# Patient Record
Sex: Female | Born: 1935 | Race: White | Hispanic: No
Health system: Southern US, Community
[De-identification: ages and names within clinical notes are randomized; demographics above are authoritative.]

## PROBLEM LIST (undated history)

## (undated) DIAGNOSIS — T148XXA Other injury of unspecified body region, initial encounter: Secondary | ICD-10-CM

## (undated) DIAGNOSIS — E7800 Pure hypercholesterolemia, unspecified: Secondary | ICD-10-CM

## (undated) DIAGNOSIS — R55 Syncope and collapse: Secondary | ICD-10-CM

## (undated) DIAGNOSIS — S0101XA Laceration without foreign body of scalp, initial encounter: Secondary | ICD-10-CM

## (undated) DIAGNOSIS — E119 Type 2 diabetes mellitus without complications: Secondary | ICD-10-CM

## (undated) DIAGNOSIS — I10 Essential (primary) hypertension: Secondary | ICD-10-CM

## (undated) HISTORY — DX: Other injury of unspecified body region, initial encounter: T14.8XXA

## (undated) HISTORY — PX: CHOLECYSTECTOMY: SHX55

## (undated) HISTORY — PX: CARDIAC VALVE REPLACEMENT: SHX585

## (undated) HISTORY — PX: CATARACT EXTRACTION W/ INTRAOCULAR LENS IMPLANT: SHX1309

## (undated) HISTORY — DX: Syncope and collapse: R55

## (undated) HISTORY — DX: Type 2 diabetes mellitus without complications: E11.9

## (undated) HISTORY — DX: Essential (primary) hypertension: I10

## (undated) HISTORY — DX: Laceration without foreign body of scalp, initial encounter: S01.01XA

## (undated) HISTORY — DX: Pure hypercholesterolemia, unspecified: E78.00

---

## 2009-02-17 ENCOUNTER — Ambulatory Visit: Payer: Self-pay | Admitting: Thoracic Surgery (Cardiothoracic Vascular Surgery)

## 2009-03-04 ENCOUNTER — Encounter: Payer: Self-pay | Admitting: Thoracic Surgery (Cardiothoracic Vascular Surgery)

## 2009-03-04 ENCOUNTER — Encounter
Admission: RE | Admit: 2009-03-04 | Discharge: 2009-03-04 | Payer: Self-pay | Admitting: Thoracic Surgery (Cardiothoracic Vascular Surgery)

## 2009-03-04 ENCOUNTER — Ambulatory Visit (HOSPITAL_COMMUNITY)
Admission: RE | Admit: 2009-03-04 | Discharge: 2009-03-04 | Payer: Self-pay | Admitting: Thoracic Surgery (Cardiothoracic Vascular Surgery)

## 2009-03-10 ENCOUNTER — Ambulatory Visit: Payer: Self-pay | Admitting: Vascular Surgery

## 2009-03-10 ENCOUNTER — Encounter: Payer: Self-pay | Admitting: Thoracic Surgery (Cardiothoracic Vascular Surgery)

## 2009-03-10 ENCOUNTER — Ambulatory Visit: Payer: Self-pay | Admitting: Thoracic Surgery (Cardiothoracic Vascular Surgery)

## 2009-03-12 ENCOUNTER — Encounter: Payer: Self-pay | Admitting: Thoracic Surgery (Cardiothoracic Vascular Surgery)

## 2009-03-12 ENCOUNTER — Ambulatory Visit: Payer: Self-pay | Admitting: Thoracic Surgery (Cardiothoracic Vascular Surgery)

## 2009-03-12 ENCOUNTER — Inpatient Hospital Stay (HOSPITAL_COMMUNITY)
Admission: RE | Admit: 2009-03-12 | Discharge: 2009-03-16 | Payer: Self-pay | Admitting: Thoracic Surgery (Cardiothoracic Vascular Surgery)

## 2009-03-31 ENCOUNTER — Encounter: Payer: Self-pay | Admitting: Thoracic Surgery (Cardiothoracic Vascular Surgery)

## 2009-03-31 ENCOUNTER — Ambulatory Visit (HOSPITAL_COMMUNITY)
Admission: RE | Admit: 2009-03-31 | Discharge: 2009-03-31 | Payer: Self-pay | Source: Home / Self Care | Admitting: Thoracic Surgery (Cardiothoracic Vascular Surgery)

## 2009-03-31 ENCOUNTER — Encounter
Admission: RE | Admit: 2009-03-31 | Discharge: 2009-03-31 | Payer: Self-pay | Admitting: Thoracic Surgery (Cardiothoracic Vascular Surgery)

## 2009-03-31 ENCOUNTER — Ambulatory Visit: Payer: Self-pay | Admitting: Thoracic Surgery (Cardiothoracic Vascular Surgery)

## 2009-05-12 ENCOUNTER — Ambulatory Visit: Payer: Self-pay | Admitting: Thoracic Surgery (Cardiothoracic Vascular Surgery)

## 2010-02-01 ENCOUNTER — Encounter: Payer: Self-pay | Admitting: Thoracic Surgery (Cardiothoracic Vascular Surgery)

## 2010-04-02 LAB — COMPREHENSIVE METABOLIC PANEL
ALT: 19 U/L (ref 0–35)
AST: 20 U/L (ref 0–37)
Albumin: 3.6 g/dL (ref 3.5–5.2)
Alkaline Phosphatase: 64 U/L (ref 39–117)
BUN: 36 mg/dL — ABNORMAL HIGH (ref 6–23)
CO2: 23 mEq/L (ref 19–32)
Calcium: 9.2 mg/dL (ref 8.4–10.5)
Chloride: 103 mEq/L (ref 96–112)
Creatinine, Ser: 1.53 mg/dL — ABNORMAL HIGH (ref 0.4–1.2)
GFR calc Af Amer: 40 mL/min — ABNORMAL LOW (ref >60)
GFR calc non Af Amer: 33 mL/min — ABNORMAL LOW (ref >60)
Glucose, Bld: 222 mg/dL — ABNORMAL HIGH (ref 70–99)
Potassium: 4 mEq/L (ref 3.5–5.1)
Sodium: 136 mEq/L (ref 135–145)
Total Bilirubin: 0.5 mg/dL (ref 0.3–1.2)
Total Protein: 6.6 g/dL (ref 6.0–8.3)

## 2010-04-02 LAB — BLOOD GAS, ARTERIAL
Acid-Base Excess: 0.7 mmol/L (ref 0.0–2.0)
Bicarbonate: 24.5 mEq/L — ABNORMAL HIGH (ref 20.0–24.0)
Drawn by: 206361
FIO2: 0.21 %
O2 Saturation: 99.2 %
Patient temperature: 98.6
TCO2: 25.7 mmol/L (ref 0–100)
pCO2 arterial: 37.8 mmHg (ref 35.0–45.0)
pH, Arterial: 7.428 — ABNORMAL HIGH (ref 7.350–7.400)
pO2, Arterial: 106 mmHg — ABNORMAL HIGH (ref 80.0–100.0)

## 2010-04-02 LAB — CBC
HCT: 32.3 % — ABNORMAL LOW (ref 36.0–46.0)
Hemoglobin: 11 g/dL — ABNORMAL LOW (ref 12.0–15.0)
MCHC: 34.1 g/dL (ref 30.0–36.0)
MCV: 83.7 fL (ref 78.0–100.0)
Platelets: 212 10*3/uL (ref 150–400)
RBC: 3.86 MIL/uL — ABNORMAL LOW (ref 3.87–5.11)
RDW: 16.5 % — ABNORMAL HIGH (ref 11.5–15.5)
WBC: 9.1 10*3/uL (ref 4.0–10.5)

## 2010-04-02 LAB — URINALYSIS, ROUTINE W REFLEX MICROSCOPIC
Bilirubin Urine: NEGATIVE
Glucose, UA: 100 mg/dL — AB
Hgb urine dipstick: NEGATIVE
Ketones, ur: NEGATIVE mg/dL
Nitrite: NEGATIVE
Protein, ur: NEGATIVE mg/dL
Specific Gravity, Urine: 1.008 (ref 1.005–1.030)
Urobilinogen, UA: 0.2 mg/dL (ref 0.0–1.0)
pH: 5 (ref 5.0–8.0)

## 2010-04-02 LAB — SURGICAL PCR SCREEN
MRSA, PCR: NEGATIVE
Staphylococcus aureus: NEGATIVE

## 2010-04-02 LAB — TYPE AND SCREEN
ABO/RH(D): A POS
Antibody Screen: NEGATIVE

## 2010-04-02 LAB — ABO/RH: ABO/RH(D): A POS

## 2010-04-02 LAB — PROTIME-INR
INR: 0.96 (ref 0.00–1.49)
Prothrombin Time: 12.7 seconds (ref 11.6–15.2)

## 2010-04-02 LAB — HEMOGLOBIN A1C
Hgb A1c MFr Bld: 6.6 % — ABNORMAL HIGH (ref 4.6–6.1)
Mean Plasma Glucose: 143 mg/dL

## 2010-04-02 LAB — APTT: aPTT: 25 seconds (ref 24–37)

## 2010-04-02 LAB — URINE MICROSCOPIC-ADD ON

## 2010-04-06 LAB — POCT I-STAT 3, ART BLOOD GAS (G3+)
Acid-base deficit: 1 mmol/L (ref 0.0–2.0)
Acid-base deficit: 1 mmol/L (ref 0.0–2.0)
Acid-base deficit: 1 mmol/L (ref 0.0–2.0)
Acid-base deficit: 1 mmol/L (ref 0.0–2.0)
Acid-base deficit: 3 mmol/L — ABNORMAL HIGH (ref 0.0–2.0)
Bicarbonate: 22.8 mEq/L (ref 20.0–24.0)
Bicarbonate: 23.6 mEq/L (ref 20.0–24.0)
Bicarbonate: 24.1 mEq/L — ABNORMAL HIGH (ref 20.0–24.0)
Bicarbonate: 24.2 mEq/L — ABNORMAL HIGH (ref 20.0–24.0)
Bicarbonate: 24.9 mEq/L — ABNORMAL HIGH (ref 20.0–24.0)
Bicarbonate: 25.3 mEq/L — ABNORMAL HIGH (ref 20.0–24.0)
Bicarbonate: 25.4 mEq/L — ABNORMAL HIGH (ref 20.0–24.0)
O2 Saturation: 100 %
O2 Saturation: 100 %
O2 Saturation: 100 %
O2 Saturation: 97 %
O2 Saturation: 97 %
O2 Saturation: 97 %
O2 Saturation: 99 %
Patient temperature: 35.7
Patient temperature: 36.9
Patient temperature: 37.1
TCO2: 24 mmol/L (ref 0–100)
TCO2: 25 mmol/L (ref 0–100)
TCO2: 25 mmol/L (ref 0–100)
TCO2: 25 mmol/L (ref 0–100)
TCO2: 26 mmol/L (ref 0–100)
TCO2: 27 mmol/L (ref 0–100)
TCO2: 27 mmol/L (ref 0–100)
pCO2 arterial: 37.8 mmHg (ref 35.0–45.0)
pCO2 arterial: 39.1 mmHg (ref 35.0–45.0)
pCO2 arterial: 40.5 mmHg (ref 35.0–45.0)
pCO2 arterial: 41.3 mmHg (ref 35.0–45.0)
pCO2 arterial: 42.7 mmHg (ref 35.0–45.0)
pCO2 arterial: 43.9 mmHg (ref 35.0–45.0)
pCO2 arterial: 47.8 mmHg — ABNORMAL HIGH (ref 35.0–45.0)
pH, Arterial: 7.323 — ABNORMAL LOW (ref 7.350–7.400)
pH, Arterial: 7.331 — ABNORMAL LOW (ref 7.350–7.400)
pH, Arterial: 7.374 (ref 7.350–7.400)
pH, Arterial: 7.379 (ref 7.350–7.400)
pH, Arterial: 7.397 (ref 7.350–7.400)
pH, Arterial: 7.398 (ref 7.350–7.400)
pH, Arterial: 7.403 — ABNORMAL HIGH (ref 7.350–7.400)
pO2, Arterial: 178 mmHg — ABNORMAL HIGH (ref 80.0–100.0)
pO2, Arterial: 258 mmHg — ABNORMAL HIGH (ref 80.0–100.0)
pO2, Arterial: 289 mmHg — ABNORMAL HIGH (ref 80.0–100.0)
pO2, Arterial: 413 mmHg — ABNORMAL HIGH (ref 80.0–100.0)
pO2, Arterial: 84 mmHg (ref 80.0–100.0)
pO2, Arterial: 91 mmHg (ref 80.0–100.0)
pO2, Arterial: 99 mmHg (ref 80.0–100.0)

## 2010-04-06 LAB — PROTIME-INR
INR: 1.47 (ref 0.00–1.49)
Prothrombin Time: 17.7 seconds — ABNORMAL HIGH (ref 11.6–15.2)

## 2010-04-06 LAB — POCT I-STAT GLUCOSE
Glucose, Bld: 108 mg/dL — ABNORMAL HIGH (ref 70–99)
Glucose, Bld: 117 mg/dL — ABNORMAL HIGH (ref 70–99)
Operator id: 195151
Operator id: 195151

## 2010-04-06 LAB — CBC
HCT: 25.7 % — ABNORMAL LOW (ref 36.0–46.0)
HCT: 30.8 % — ABNORMAL LOW (ref 36.0–46.0)
HCT: 31 % — ABNORMAL LOW (ref 36.0–46.0)
HCT: 31.9 % — ABNORMAL LOW (ref 36.0–46.0)
HCT: 32.4 % — ABNORMAL LOW (ref 36.0–46.0)
HCT: 32.9 % — ABNORMAL LOW (ref 36.0–46.0)
Hemoglobin: 10.5 g/dL — ABNORMAL LOW (ref 12.0–15.0)
Hemoglobin: 10.6 g/dL — ABNORMAL LOW (ref 12.0–15.0)
Hemoglobin: 11.1 g/dL — ABNORMAL LOW (ref 12.0–15.0)
Hemoglobin: 11.1 g/dL — ABNORMAL LOW (ref 12.0–15.0)
Hemoglobin: 11.4 g/dL — ABNORMAL LOW (ref 12.0–15.0)
Hemoglobin: 8.7 g/dL — ABNORMAL LOW (ref 12.0–15.0)
MCHC: 33.8 g/dL (ref 30.0–36.0)
MCHC: 34 g/dL (ref 30.0–36.0)
MCHC: 34.1 g/dL (ref 30.0–36.0)
MCHC: 34.2 g/dL (ref 30.0–36.0)
MCHC: 34.5 g/dL (ref 30.0–36.0)
MCHC: 34.8 g/dL (ref 30.0–36.0)
MCV: 85.4 fL (ref 78.0–100.0)
MCV: 87.3 fL (ref 78.0–100.0)
MCV: 87.5 fL (ref 78.0–100.0)
MCV: 87.7 fL (ref 78.0–100.0)
MCV: 87.8 fL (ref 78.0–100.0)
MCV: 89 fL (ref 78.0–100.0)
Platelets: 101 10*3/uL — ABNORMAL LOW (ref 150–400)
Platelets: 102 10*3/uL — ABNORMAL LOW (ref 150–400)
Platelets: 103 10*3/uL — ABNORMAL LOW (ref 150–400)
Platelets: 112 10*3/uL — ABNORMAL LOW (ref 150–400)
Platelets: 112 10*3/uL — ABNORMAL LOW (ref 150–400)
Platelets: 118 10*3/uL — ABNORMAL LOW (ref 150–400)
RBC: 3.01 MIL/uL — ABNORMAL LOW (ref 3.87–5.11)
RBC: 3.46 MIL/uL — ABNORMAL LOW (ref 3.87–5.11)
RBC: 3.53 MIL/uL — ABNORMAL LOW (ref 3.87–5.11)
RBC: 3.65 MIL/uL — ABNORMAL LOW (ref 3.87–5.11)
RBC: 3.69 MIL/uL — ABNORMAL LOW (ref 3.87–5.11)
RBC: 3.76 MIL/uL — ABNORMAL LOW (ref 3.87–5.11)
RDW: 15.4 % (ref 11.5–15.5)
RDW: 15.5 % (ref 11.5–15.5)
RDW: 15.6 % — ABNORMAL HIGH (ref 11.5–15.5)
RDW: 15.7 % — ABNORMAL HIGH (ref 11.5–15.5)
RDW: 15.9 % — ABNORMAL HIGH (ref 11.5–15.5)
RDW: 16.2 % — ABNORMAL HIGH (ref 11.5–15.5)
WBC: 11.1 10*3/uL — ABNORMAL HIGH (ref 4.0–10.5)
WBC: 12.3 10*3/uL — ABNORMAL HIGH (ref 4.0–10.5)
WBC: 13.5 10*3/uL — ABNORMAL HIGH (ref 4.0–10.5)
WBC: 13.6 10*3/uL — ABNORMAL HIGH (ref 4.0–10.5)
WBC: 15 10*3/uL — ABNORMAL HIGH (ref 4.0–10.5)
WBC: 15.6 10*3/uL — ABNORMAL HIGH (ref 4.0–10.5)

## 2010-04-06 LAB — POCT I-STAT 4, (NA,K, GLUC, HGB,HCT)
Glucose, Bld: 108 mg/dL — ABNORMAL HIGH (ref 70–99)
Glucose, Bld: 122 mg/dL — ABNORMAL HIGH (ref 70–99)
Glucose, Bld: 127 mg/dL — ABNORMAL HIGH (ref 70–99)
Glucose, Bld: 148 mg/dL — ABNORMAL HIGH (ref 70–99)
Glucose, Bld: 180 mg/dL — ABNORMAL HIGH (ref 70–99)
Glucose, Bld: 180 mg/dL — ABNORMAL HIGH (ref 70–99)
Glucose, Bld: 189 mg/dL — ABNORMAL HIGH (ref 70–99)
Glucose, Bld: 92 mg/dL (ref 70–99)
HCT: 20 % — ABNORMAL LOW (ref 36.0–46.0)
HCT: 22 % — ABNORMAL LOW (ref 36.0–46.0)
HCT: 22 % — ABNORMAL LOW (ref 36.0–46.0)
HCT: 26 % — ABNORMAL LOW (ref 36.0–46.0)
HCT: 26 % — ABNORMAL LOW (ref 36.0–46.0)
HCT: 27 % — ABNORMAL LOW (ref 36.0–46.0)
HCT: 31 % — ABNORMAL LOW (ref 36.0–46.0)
HCT: 33 % — ABNORMAL LOW (ref 36.0–46.0)
Hemoglobin: 10.5 g/dL — ABNORMAL LOW (ref 12.0–15.0)
Hemoglobin: 11.2 g/dL — ABNORMAL LOW (ref 12.0–15.0)
Hemoglobin: 6.8 g/dL — CL (ref 12.0–15.0)
Hemoglobin: 7.5 g/dL — ABNORMAL LOW (ref 12.0–15.0)
Hemoglobin: 7.5 g/dL — ABNORMAL LOW (ref 12.0–15.0)
Hemoglobin: 8.8 g/dL — ABNORMAL LOW (ref 12.0–15.0)
Hemoglobin: 8.8 g/dL — ABNORMAL LOW (ref 12.0–15.0)
Hemoglobin: 9.2 g/dL — ABNORMAL LOW (ref 12.0–15.0)
Potassium: 3.9 mEq/L (ref 3.5–5.1)
Potassium: 4 mEq/L (ref 3.5–5.1)
Potassium: 4 mEq/L (ref 3.5–5.1)
Potassium: 4.3 mEq/L (ref 3.5–5.1)
Potassium: 4.5 mEq/L (ref 3.5–5.1)
Potassium: 4.8 mEq/L (ref 3.5–5.1)
Potassium: 4.9 mEq/L (ref 3.5–5.1)
Potassium: 5.4 mEq/L — ABNORMAL HIGH (ref 3.5–5.1)
Sodium: 132 mEq/L — ABNORMAL LOW (ref 135–145)
Sodium: 132 mEq/L — ABNORMAL LOW (ref 135–145)
Sodium: 132 mEq/L — ABNORMAL LOW (ref 135–145)
Sodium: 133 mEq/L — ABNORMAL LOW (ref 135–145)
Sodium: 136 mEq/L (ref 135–145)
Sodium: 138 mEq/L (ref 135–145)
Sodium: 139 mEq/L (ref 135–145)
Sodium: 139 mEq/L (ref 135–145)

## 2010-04-06 LAB — POCT I-STAT, CHEM 8
BUN: 29 mg/dL — ABNORMAL HIGH (ref 6–23)
BUN: 32 mg/dL — ABNORMAL HIGH (ref 6–23)
Calcium, Ion: 1.08 mmol/L — ABNORMAL LOW (ref 1.12–1.32)
Calcium, Ion: 1.1 mmol/L — ABNORMAL LOW (ref 1.12–1.32)
Chloride: 101 mEq/L (ref 96–112)
Chloride: 109 mEq/L (ref 96–112)
Creatinine, Ser: 1.2 mg/dL (ref 0.4–1.2)
Creatinine, Ser: 1.8 mg/dL — ABNORMAL HIGH (ref 0.4–1.2)
Glucose, Bld: 148 mg/dL — ABNORMAL HIGH (ref 70–99)
Glucose, Bld: 175 mg/dL — ABNORMAL HIGH (ref 70–99)
HCT: 29 % — ABNORMAL LOW (ref 36.0–46.0)
HCT: 33 % — ABNORMAL LOW (ref 36.0–46.0)
Hemoglobin: 11.2 g/dL — ABNORMAL LOW (ref 12.0–15.0)
Hemoglobin: 9.9 g/dL — ABNORMAL LOW (ref 12.0–15.0)
Potassium: 4.3 mEq/L (ref 3.5–5.1)
Potassium: 4.8 mEq/L (ref 3.5–5.1)
Sodium: 134 mEq/L — ABNORMAL LOW (ref 135–145)
Sodium: 138 mEq/L (ref 135–145)
TCO2: 26 mmol/L (ref 0–100)
TCO2: 26 mmol/L (ref 0–100)

## 2010-04-06 LAB — BASIC METABOLIC PANEL
BUN: 29 mg/dL — ABNORMAL HIGH (ref 6–23)
BUN: 35 mg/dL — ABNORMAL HIGH (ref 6–23)
BUN: 35 mg/dL — ABNORMAL HIGH (ref 6–23)
BUN: 39 mg/dL — ABNORMAL HIGH (ref 6–23)
CO2: 24 mEq/L (ref 19–32)
CO2: 25 mEq/L (ref 19–32)
CO2: 25 mEq/L (ref 19–32)
CO2: 28 mEq/L (ref 19–32)
Calcium: 7.1 mg/dL — ABNORMAL LOW (ref 8.4–10.5)
Calcium: 8.1 mg/dL — ABNORMAL LOW (ref 8.4–10.5)
Calcium: 8.4 mg/dL (ref 8.4–10.5)
Calcium: 8.4 mg/dL (ref 8.4–10.5)
Chloride: 103 mEq/L (ref 96–112)
Chloride: 103 mEq/L (ref 96–112)
Chloride: 109 mEq/L (ref 96–112)
Chloride: 99 mEq/L (ref 96–112)
Creatinine, Ser: 1.3 mg/dL — ABNORMAL HIGH (ref 0.4–1.2)
Creatinine, Ser: 1.37 mg/dL — ABNORMAL HIGH (ref 0.4–1.2)
Creatinine, Ser: 1.38 mg/dL — ABNORMAL HIGH (ref 0.4–1.2)
Creatinine, Ser: 1.49 mg/dL — ABNORMAL HIGH (ref 0.4–1.2)
GFR calc Af Amer: 42 mL/min — ABNORMAL LOW (ref >60)
GFR calc Af Amer: 46 mL/min — ABNORMAL LOW (ref >60)
GFR calc Af Amer: 46 mL/min — ABNORMAL LOW (ref >60)
GFR calc Af Amer: 49 mL/min — ABNORMAL LOW (ref >60)
GFR calc non Af Amer: 34 mL/min — ABNORMAL LOW (ref >60)
GFR calc non Af Amer: 38 mL/min — ABNORMAL LOW (ref >60)
GFR calc non Af Amer: 38 mL/min — ABNORMAL LOW (ref >60)
GFR calc non Af Amer: 40 mL/min — ABNORMAL LOW (ref >60)
Glucose, Bld: 113 mg/dL — ABNORMAL HIGH (ref 70–99)
Glucose, Bld: 115 mg/dL — ABNORMAL HIGH (ref 70–99)
Glucose, Bld: 144 mg/dL — ABNORMAL HIGH (ref 70–99)
Glucose, Bld: 147 mg/dL — ABNORMAL HIGH (ref 70–99)
Potassium: 3.6 mEq/L (ref 3.5–5.1)
Potassium: 3.7 mEq/L (ref 3.5–5.1)
Potassium: 4 mEq/L (ref 3.5–5.1)
Potassium: 4.1 mEq/L (ref 3.5–5.1)
Sodium: 132 mEq/L — ABNORMAL LOW (ref 135–145)
Sodium: 135 mEq/L (ref 135–145)
Sodium: 135 mEq/L (ref 135–145)
Sodium: 139 mEq/L (ref 135–145)

## 2010-04-06 LAB — MAGNESIUM
Magnesium: 2.6 mg/dL — ABNORMAL HIGH (ref 1.5–2.5)
Magnesium: 2.6 mg/dL — ABNORMAL HIGH (ref 1.5–2.5)
Magnesium: 3 mg/dL — ABNORMAL HIGH (ref 1.5–2.5)

## 2010-04-06 LAB — GLUCOSE, CAPILLARY
Glucose-Capillary: 103 mg/dL — ABNORMAL HIGH (ref 70–99)
Glucose-Capillary: 104 mg/dL — ABNORMAL HIGH (ref 70–99)
Glucose-Capillary: 107 mg/dL — ABNORMAL HIGH (ref 70–99)
Glucose-Capillary: 114 mg/dL — ABNORMAL HIGH (ref 70–99)
Glucose-Capillary: 133 mg/dL — ABNORMAL HIGH (ref 70–99)
Glucose-Capillary: 135 mg/dL — ABNORMAL HIGH (ref 70–99)
Glucose-Capillary: 142 mg/dL — ABNORMAL HIGH (ref 70–99)
Glucose-Capillary: 150 mg/dL — ABNORMAL HIGH (ref 70–99)
Glucose-Capillary: 152 mg/dL — ABNORMAL HIGH (ref 70–99)
Glucose-Capillary: 154 mg/dL — ABNORMAL HIGH (ref 70–99)
Glucose-Capillary: 155 mg/dL — ABNORMAL HIGH (ref 70–99)
Glucose-Capillary: 156 mg/dL — ABNORMAL HIGH (ref 70–99)
Glucose-Capillary: 159 mg/dL — ABNORMAL HIGH (ref 70–99)
Glucose-Capillary: 162 mg/dL — ABNORMAL HIGH (ref 70–99)
Glucose-Capillary: 163 mg/dL — ABNORMAL HIGH (ref 70–99)
Glucose-Capillary: 169 mg/dL — ABNORMAL HIGH (ref 70–99)
Glucose-Capillary: 170 mg/dL — ABNORMAL HIGH (ref 70–99)
Glucose-Capillary: 173 mg/dL — ABNORMAL HIGH (ref 70–99)
Glucose-Capillary: 173 mg/dL — ABNORMAL HIGH (ref 70–99)
Glucose-Capillary: 177 mg/dL — ABNORMAL HIGH (ref 70–99)
Glucose-Capillary: 190 mg/dL — ABNORMAL HIGH (ref 70–99)
Glucose-Capillary: 82 mg/dL (ref 70–99)

## 2010-04-06 LAB — PLATELET COUNT: Platelets: 167 10*3/uL (ref 150–400)

## 2010-04-06 LAB — CREATININE, SERUM
Creatinine, Ser: 1.24 mg/dL — ABNORMAL HIGH (ref 0.4–1.2)
Creatinine, Ser: 1.64 mg/dL — ABNORMAL HIGH (ref 0.4–1.2)
GFR calc Af Amer: 37 mL/min — ABNORMAL LOW (ref >60)
GFR calc Af Amer: 52 mL/min — ABNORMAL LOW (ref >60)
GFR calc non Af Amer: 31 mL/min — ABNORMAL LOW (ref >60)
GFR calc non Af Amer: 43 mL/min — ABNORMAL LOW (ref >60)

## 2010-04-06 LAB — MRSA PCR SCREENING: MRSA by PCR: NEGATIVE

## 2010-04-06 LAB — POCT I-STAT 3, VENOUS BLOOD GAS (G3P V)
Acid-base deficit: 2 mmol/L (ref 0.0–2.0)
Bicarbonate: 23.2 mEq/L (ref 20.0–24.0)
O2 Saturation: 71 %
TCO2: 24 mmol/L (ref 0–100)
pCO2, Ven: 40.4 mmHg — ABNORMAL LOW (ref 45.0–50.0)
pH, Ven: 7.367 — ABNORMAL HIGH (ref 7.250–7.300)
pO2, Ven: 39 mmHg (ref 30.0–45.0)

## 2010-04-06 LAB — HEMOGLOBIN AND HEMATOCRIT, BLOOD
HCT: 26.8 % — ABNORMAL LOW (ref 36.0–46.0)
Hemoglobin: 9.1 g/dL — ABNORMAL LOW (ref 12.0–15.0)

## 2010-04-06 LAB — APTT: aPTT: 35 seconds (ref 24–37)

## 2010-05-26 NOTE — Assessment & Plan Note (Signed)
OFFICE VISIT   [name], [name]  DOB:  [birth year]                                        May 12, 2009  CHART #:  [id number]   HISTORY OF PRESENT ILLNESS:  The patient returns for further followup  status post aortic valve replacement 2 months ago.  She was last seen  here in the office on March 31, 2009.  At that time, she was doing quite  well, although she did have significant lower extremity edema that was  more prominent in the right lower extremity than the left.  We did a  lower extremity venous duplex scan that did not reveal any sign to  suggest the presence of the deep venous thrombosis.  She was restarted  on oral diuretic therapy at that time.  Since then, she had done quite  well.  She reports that she is taking part in the cardiac rehab program  and continues to see improvement every day.  She notes that her exercise  tolerance is already much better than ever was prior to surgery.  Her  lower extremity swelling has decreased, although it has not completely  gone away.  She notes that if she is up on her feet for prolonged period  of time, the swelling seems to get worse.  Overall, she continues to get  better every day and she feels quite well.  She has no fevers, chills,  or productive cough.  She has no shortness of breath.  The remainder of  her review of systems is unremarkable.   PHYSICAL EXAMINATION:  Notable for a well-appearing female with blood  pressure 133/68, pulse 72, and oxygen saturation 97% on room air.  Examination of the chest notable for a median sternotomy scar that has  healed nicely.  The sternum is stable on palpation.  Auscultation  reveals clear breath sounds, which are symmetrical bilaterally.  No  wheezes, rales, or rhonchi are noted.  Cardiovascular exam is notable  for regular rate and rhythm.  No murmurs, rubs, or gallops are  appreciated.  The abdomen is soft and nontender.  The extremities are  warm and well perfused.   There is mild bilateral lower extremity edema  that is slightly worse on the right compared to the left.  No other  abnormalities are noted.   IMPRESSION:  Excellent progress following aortic valve replacement.   PLAN:  I have encouraged the patient to continue increase her physical  activity as tolerated with her only limitation at this point remaining  that she refrain from heavy lifting or strenuous use of her arms or  shoulders for at least another 6 weeks or so.  All of her questions have  been addressed.  In the future she will call or return to see [place] as  needed.   Valentina Gu. Roxy Manns, M.D.  Electronically Signed   CHO/MEDQ  D:  05/12/2009  T:  05/13/2009  Job:  [id number]   cc:   Carlisle Beers, M.D.  Verdell Carmine, M.D.

## 2010-05-26 NOTE — H&P (Signed)
HISTORY AND PHYSICAL EXAMINATION   February 17, 2009   Re:  BATUL, MENTING         DOB:  09-05-1935   PRESENTING CHIEF COMPLAINT:  Exertional shortness of breath.   HISTORY OF PRESENT ILLNESS:  The patient is a 75 year old female from  [place], [place] who is known about the presence of a heart  murmur for many years.  This apparently was picked up on routine  physical exam in the distant past.  More recently, she has developed  progressive symptoms of exertional shortness of breath over the past 6  months to a year.  She was seen in followup by her primary care  physician, Dr. Ward Givens at [facility] in [place].  A  followup echocardiogram was performed in their office on January 24, 2009.  By report, this exam demonstrated the presence of severe aortic  stenosis with peak velocity across the valve measured 4.4 m/sec  corresponding to peak and mean transvalvular gradients of 78 and 58 mmHg  respectively.  The aortic valve area was estimated 0.9 cm2.  There was  reportedly normal left ventricular systolic function with moderate  concentric left ventricular hypertrophy.  There was mild thickening of  the mitral valve with moderate mitral annular calcification and mild  mitral regurgitation.  No other significant abnormalities were commented  upon.  The patient was referred to Dr. Pati Gallo at [facility] in [place].  He evaluated her on February 03, 2009 and  subsequently scheduled her for elective left and right heart  catheterization.  This was performed on February 13, 2009 here at [facility].  Findings at the time of catheterization  performed by Dr. Jeneen Rinks McGukin document presence of no significant  coronary artery disease, but severe aortic stenosis.  The mean gradient  across the aortic valve was estimated 31 mmHg.  This corresponding to  aortic valve area estimated between 0.5 and 0.69 cm2.   Pulmonary artery  pressures measured 39/26 with pulmonary capillary wedge pressure of 22.  Resting cardiac output varied between 3.2 and 4.2 L per minute depending  upon the method of Fick versus the use of thermodilution.  There was  some question about the possibility of some proximal right coronary  artery stenosis, but otherwise no other significant coronary artery  disease appreciated.  The patient was referred for surgical  intervention.   REVIEW OF SYSTEMS:  GENERAL:  The patient reports normal appetite.  She  states that she has probably gained between 10 and 20 pounds in weight  over the last 6 months.  She does not think that she has been eating  more and she is concerned that this may be due to some fluid retention.  She is 5 feet 2 inches tall and currently weighs approximately 197  pounds.  CARDIAC:  The patient denies any chest pain, chest tightness, or chest  pressure either with activity or rest.  She has had a 36-month history  of progressive symptoms of exertional shortness of breath that has  become particularly bothers her over the last 6 months.  She denies any  resting shortness of breath, PND, orthopnea, palpitations, or syncope.  She has had occasional mild dizzy spells.  EXTREMITIES:  She has had some bilateral lower extremity edema.  RESPIRATORY:  Notable for persistent dry, hacking cough that is  nonproductive.  She denies hemoptysis and wheezing.  GASTROINTESTINAL:  Notable for chronic problems with diarrhea that has  been stable for some time.  She has had colonoscopies in the past and  never had any pathologic findings identified.  She also does report some  difficulty swallowing.  She states that she does not have any trouble  with liquids, but occasionally some solid foods will have a tendency to  get stuck part way down.  She has not been formally evaluated this on  medical basis and she denies symptoms of reflux per se.  GENITOURINARY:  Negative.   MUSCULOSKELETAL:  Negative.  The patient denies significant problems  with arthritis or arthralgias.  NEUROLOGIC:  Negative.  HEENT:  Negative.  The patient has good dentition, sees her dentist on a  regular basis.  HEMATOLOGIC:  Negative.   PAST MEDICAL HISTORY:  1. Aortic stenosis.  2. Hypertension.  3. Type 2 diabetes mellitus.  4. Hyperlipidemia.   PAST SURGICAL HISTORY:  Cholecystectomy.   FAMILY HISTORY:  Noncontributory.   SOCIAL HISTORY:  The patient is single and lives alone in [place],  although her daughter lives in house right next door.  Her daughter is  an only child.  The patient has been retired for the past 6 years,  having previously worked as an Optometrist.  She is a nonsmoker.  She  denies significant alcohol use.   CURRENT MEDICATIONS:  1. Toprol-XL 25 mg daily.  2. Metformin 1000 mg every morning and 1000 mg every evening.  3. Pravastatin 40 mg daily.  4. Glimepiride 0.5 mg daily.  5. Benazepril 40 mg twice daily.  6. Ferrous sulfate 1 tablet daily.  7. Furosemide 40 mg daily.  8. Fish oil 1000 mg twice daily.  9. Lutein 20 mg daily.   DRUG ALLERGIES:  None known.   PHYSICAL EXAMINATION:  General:  The patient is a well appearing, mildly  obese white female who appears her stated age in no acute distress.  HEENT:  Grossly unrevealing.  Neck:  Supple.  There are no carotid  bruits.  There is no jugular venous distention.  Lungs:  Auscultation of  the chest reveals clear breath sounds, which are symmetrical  bilaterally.  No wheezes, rales, or rhonchi are noted.  Cardiovascular:  Notable for a crescendo-decrescendo systolic murmur heard best along the  right sternal border with radiation up towards the neck.  No diastolic  murmurs are noted.  Abdomen:  Soft, moderately obese, and nontender.  There are no palpable masses.  Extremities:  Warm and adequately  perfused.  Extremities:  There is mild-to-moderate bilateral lower  extremity edema.  Distal  pulses are not palpable in either lower leg.  Femoral pulses are somewhat difficult to palpate as well due to her body  habitus.  Rectal and GU:  Both deferred.  Neurologic:  Grossly nonfocal  and symmetrical throughout.   Left and right heart catheterization performed February 14, 2008 by Dr.  Beatrix Fetters is reviewed.  This demonstrates the presence of severe aortic  stenosis with no significant coronary artery disease.  There is some  question raised about the presence of possible mild proximal right  coronary artery stenosis, but I do not appreciate any significant  stenosis in the right coronary artery.  Contrast easily flushes back  into the aorta around the catheter, the ostium of the right coronary  artery appears to be twice the size of the catheter used for  angiography.  There is right-dominant coronary circulation.  The left  coronary system is entirely normal.  No other abnormalities are noted.  There is  significant calcification within the leaflets of the valve and  the aortic root.  Left ventricular systolic function appears normal.  Pressure data are as noted previously.   IMPRESSION:  Severe aortic stenosis with worsening symptoms of  exertional shortness of breath.  I agree that the patient would best be  treated with elective aortic valve replacement.  I am not impressed by  the presence of any significant coronary artery disease on her  catheterization, and at this point I do not think that concomitant  coronary artery bypass grafting is necessary.  We do not have ability to  directly review the echocardiogram that was performed in [place] to  further assess the functional anatomy of aortic valve or the size of the  aortic root.   PLAN:  I have discussed options at length with the patient and her  daughter here in the office today.  She is very interested in the  possibility of seeking minimally invasive approach for aortic valve  replacement if this is possible.  Based  upon her body habitus, I do not  favor use of mini thoracotomy approach in this particular patient.  She  might be a candidate for partial sternotomy approach.  We have discussed  the indications, risks, and potential benefits of aortic valve  replacement.  We have discussed the relative risks and benefits of  minimally invasive approach versus a full conventional sternotomy.  We  will obtain 2-D echocardiogram to further assess the functional anatomy  of the aortic valve and the size of the aortic root.  If her aortic root  is relatively small, a full sternotomy might be more conservative and  sensible approach.  We discussed alternatives with respect to what type  of valve to use to replace her aortic valve with.  We have particularly  discussed the relative risks and benefits of bioprosthetic tissue valve  versus mechanical valves and the associated need for long-term  anticoagulation.  After considerable discussion, the patient  specifically requests that we replace her valve using a bioprosthetic  tissue valve.  Under the circumstances, I think this is probably the  best choice, although she does understand that there is a small risk of  late structural valve deterioration and failure depending upon her  longevity.  She and her daughter both understand and accept all  associated risks of surgery including, but not limited to risk of death,  stroke, myocardial infarction, congestive heart failure, respiratory  failure, pneumonia, bleeding requiring blood transfusion, arrhythmia,  heart block and bradycardia requiring permanent pacemaker, late  complications related to valve replacement.  We will obtain CT angiogram  of the thoracic abdominal aorta and iliac vessels to further assess the  significance of underlying peripheral vascular disease and to see  whether or not femoral artery cannulation might be possible.  We will  also obtain upper GI barium swallow due to her intermittent  symptoms of  mild dysphagia to make sure that she does not have significant  esophageal stricture or other pathology that could become problematic.  We will tentatively plan to proceed with surgery on Wednesday, March 12, 2009 at [facility].  The patient will return to the  office for further consultation and followup of her test results on  March 10, 2009.  All of their questions have been addressed.   Valentina Gu. Roxy Manns, M.D.  Electronically Signed   CHO/MEDQ  D:  02/17/2009  T:  02/18/2009  Job:  [id number]   cc:  Malkiat Dhatt, M.D.  Verdell Carmine, M.D.

## 2010-05-26 NOTE — Assessment & Plan Note (Signed)
OFFICE VISIT   [name], [name]  DOB:  22-Sep-1935                                        March 10, 2009  CHART #:  [id number]   HISTORY OF PRESENT ILLNESS:  The patient returns to the office today for  followup with tentative plan to proceed with aortic valve replacement on  Wednesday, March 11, 2009.  She was originally seen in consultation on  February 17, 2009, and a full consultation report history and physical  exam were dictated at that time.  Since then, she has continued to  remain clinically stable and she has not developed any new problems or  complaints.  She continues to have symptoms of exertional shortness of  breath.  These have not changed.  She has not had any fevers, chills, or  productive cough.  Her appetite is stable.  Bowel function is normal.  The remainder of her review of systems is unchanged from previously.   DIAGNOSTIC TESTS:  CT angiogram of the chest, abdomen, pelvis was  performed on March 04, 2009.  This revealed moderate atherosclerotic  disease involving the thoracic and abdominal aorta and their branches.  There were no flow-limiting lesions.  There was a calcified granuloma  deep in the left lower lobe that had benign characteristics.  There was  colonic diverticulosis without inflammatory disease.  There was a small  hiatal hernia.  There was mild bilateral adrenal hyperplasia.  No other  significant abnormalities were noted.   Upper GI barium contrast study was performed on March 04, 2009.  This  confirmed the presence of a small hiatal hernia with wide-open Schatzki  ring that easily allowed passage of a 12.5-mm barium tablet.  There was  no sign of any fixed esophageal stricture or mass.  There was moderate  esophageal dysmotility, and this was felt to be likely source of the  patient's underlying dysphagia.  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  No other abnormalities are noted.   ASSESSMENT AND PLAN:  I have reviewed these findings with the patient  here in the office today.  We have also again reviewed the indications,  risks, and potential benefits of aortic valve replacement.  Because of  her body habitus, I do not favor use of mini thoracotomy approach.  We  will plan to proceed with surgery on Wednesday for aortic valve  replacement.  His findings at the time of transesophageal echocardiogram  following induction are favorable, we may proceed with a partial upper  mini sternotomy.  However, if her aortic root appears relatively small  and/or there are other complicating features, we will proceed with a  full median sternotomy.  The patient continues to express consistent  desire that we replace her valve using a bioprosthetic tissue valve.  She understands that this will come with a small risk of late structural  valve deterioration and failure depending upon her longevity.  All of  her questions have been addressed.    Valentina Gu. Roxy Manns, M.D.  Electronically Signed   CHO/MEDQ  D:  03/10/2009  T:  03/11/2009  Job:  [id number]   cc:   Carlisle Beers, M.D.  Verdell Carmine, M.D.

## 2010-05-26 NOTE — Assessment & Plan Note (Signed)
OFFICE VISIT   [name], [name]  DOB:  April 02, 1935                                        March 31, 2009  CHART #:  [id number]   HISTORY OF PRESENT ILLNESS:  The patient returns to the office today for  followup status post aortic valve replacement on March 12, 2009 with the  21-mm [facility] Ease pericardial tissue valve.  Her postoperative  recovery has been essentially uncomplicated.  Following hospital  discharge, she has continued to gradually get better, although she has  had problems with pain and swelling in her right lower extremity as well  as difficulty sleeping.  She was seen in the office by Dr. Pati Gallo last  week.  She was started on spironolactone at that visit and potassium  supplementation was discontinued.  She returns to our office for further  followup today.  Overall, she reports that her breathing is quite good.  She has mild residual soreness in her chest, but she states that she  still has problems sleeping at night because she has difficulty trying  to get comfortable.  Her appetite is good.  She has significant  discomfort in her right lower leg related to swelling.  This has limited  her ability to ambulate at home.  She has no other specific complaints.   MEDICATIONS:  Otherwise, remained unchanged from the time of hospital  discharge.   PHYSICAL EXAMINATION:  Notable for well-appearing obese female with  blood pressure 139/73, pulse 92, and oxygen saturation 97% on room air.  Examination of the chest is notable for median sternotomy incision that  is healing very nicely.  The sternum is stable on palpation.  Breath  sounds are clear to auscultation and symmetrical bilaterally.  No  wheezes, rales, or rhonchi are noted.  Cardiovascular exam includes  regular rate and rhythm.  No murmurs, rubs, or gallops are appreciated.  The abdomen is soft and nontender.  The extremities are warm and well  perfused.  There is bilateral lower  extremity edema, but this is much  more pronounced on the right side than left.  There is some compensatory  erythema in the right lower leg near the ankle consistent with venous  insufficiency.  The skin is shiny and tight.  There is dark purple  ecchymosis in the right thigh consistent with resolving right groin  hematoma presumably from her preoperative cardiac catheterization and/or  percutaneous venous cannulation at the time of surgery.  No other  abnormalities are noted.   IMPRESSION:  Overall, the patient looks pretty good, but I am concerned  that she might have deep venous thrombosis as the evidence by  significant difference in swelling from the right leg to the left.  She  still has overall bilateral lower extremity edema and volume excess, and  I do think she continues to need to remain on diuretic therapy.  However, we must be concerned about the possibility of deep venous  thrombosis particularly as she has not been very mobile postoperatively.   PLAN:  We will send the patient for lower extremity duplex scan today.  If this is positive, she will certainly need to be anticoagulated.  I  have given her prescription for Ambien to use to help her sleep at  night, and I have also refilled her prescription for Lasix 40 mg daily.  I have cautioned her to continue to keep an eye on her daily weight to  look for trends to see if she has been gradually gaining weight due to  fluid retention.  I have otherwise encouraged her to continue to  increase her physical activity and to walk as much as possible.  When  she is not walking, she should stay recumbent with her legs elevated  hopefully at least as high as the heart.  All of her questions have been  addressed.  We will plan to see her back in 6 weeks.   [name], M.D.  Electronically Signed   CHO/MEDQ  D:  03/31/2009  T:  04/01/2009  Job:  [id number]   cc:   Carlisle Beers, M.D.  Verdell Carmine, M.D.

## 2014-12-30 DIAGNOSIS — I130 Hypertensive heart and chronic kidney disease with heart failure and stage 1 through stage 4 chronic kidney disease, or unspecified chronic kidney disease: Secondary | ICD-10-CM | POA: Insufficient documentation

## 2014-12-30 DIAGNOSIS — Z954 Presence of other heart-valve replacement: Secondary | ICD-10-CM | POA: Insufficient documentation

## 2014-12-30 DIAGNOSIS — I209 Angina pectoris, unspecified: Secondary | ICD-10-CM

## 2014-12-30 DIAGNOSIS — I493 Ventricular premature depolarization: Secondary | ICD-10-CM | POA: Insufficient documentation

## 2014-12-30 DIAGNOSIS — I1310 Hypertensive heart and chronic kidney disease without heart failure, with stage 1 through stage 4 chronic kidney disease, or unspecified chronic kidney disease: Secondary | ICD-10-CM | POA: Insufficient documentation

## 2014-12-30 DIAGNOSIS — N183 Chronic kidney disease, stage 3 unspecified: Secondary | ICD-10-CM

## 2014-12-30 DIAGNOSIS — I2510 Atherosclerotic heart disease of native coronary artery without angina pectoris: Secondary | ICD-10-CM | POA: Insufficient documentation

## 2014-12-30 DIAGNOSIS — I5032 Chronic diastolic (congestive) heart failure: Secondary | ICD-10-CM

## 2014-12-30 HISTORY — DX: Hypertensive heart and chronic kidney disease without heart failure, with stage 1 through stage 4 chronic kidney disease, or unspecified chronic kidney disease: I13.10

## 2014-12-30 HISTORY — DX: Hypertensive heart and chronic kidney disease with heart failure and stage 1 through stage 4 chronic kidney disease, or unspecified chronic kidney disease: I13.0

## 2014-12-30 HISTORY — DX: Chronic diastolic (congestive) heart failure: I50.32

## 2014-12-30 HISTORY — DX: Chronic kidney disease, stage 3 unspecified: N18.30

## 2014-12-30 HISTORY — DX: Ventricular premature depolarization: I49.3

## 2014-12-30 HISTORY — DX: Angina pectoris, unspecified: I20.9

## 2014-12-30 HISTORY — DX: Presence of other heart-valve replacement: Z95.4

## 2014-12-30 HISTORY — DX: Atherosclerotic heart disease of native coronary artery without angina pectoris: I25.10

## 2015-01-12 HISTORY — PX: ABDOMINAL HERNIA REPAIR: SHX539

## 2015-04-24 DIAGNOSIS — E1151 Type 2 diabetes mellitus with diabetic peripheral angiopathy without gangrene: Secondary | ICD-10-CM | POA: Insufficient documentation

## 2015-04-24 HISTORY — DX: Type 2 diabetes mellitus with diabetic peripheral angiopathy without gangrene: E11.51

## 2015-05-14 DIAGNOSIS — K430 Incisional hernia with obstruction, without gangrene: Secondary | ICD-10-CM | POA: Insufficient documentation

## 2015-05-14 HISTORY — DX: Incisional hernia with obstruction, without gangrene: K43.0

## 2015-05-22 ENCOUNTER — Ambulatory Visit (INDEPENDENT_AMBULATORY_CARE_PROVIDER_SITE_OTHER): Payer: Medicare Other | Admitting: Allergy and Immunology

## 2015-05-22 ENCOUNTER — Encounter: Payer: Self-pay | Admitting: Allergy and Immunology

## 2015-05-22 VITALS — BP 140/76 | HR 88 | Temp 98.4°F | Resp 22 | Ht 58.66 in | Wt 191.2 lb

## 2015-05-22 DIAGNOSIS — T782XXD Anaphylactic shock, unspecified, subsequent encounter: Secondary | ICD-10-CM

## 2015-05-22 DIAGNOSIS — T782XXA Anaphylactic shock, unspecified, initial encounter: Secondary | ICD-10-CM

## 2015-05-22 DIAGNOSIS — Z79811 Long term (current) use of aromatase inhibitors: Secondary | ICD-10-CM | POA: Diagnosis not present

## 2015-05-22 DIAGNOSIS — Z79899 Other long term (current) drug therapy: Secondary | ICD-10-CM

## 2015-05-22 MED ORDER — LOSARTAN POTASSIUM 50 MG PO TABS: ORAL_TABLET | ORAL | Status: DC

## 2015-05-22 NOTE — Patient Instructions (Signed)
  1. Allergen avoidance measures  2. EpiPen, Benadryl, M.D./ER for allergic reaction  3. Blood - CBC w/diff, CMP, alpha gal panel  4. Change Lotensin to losartan 50 mg one tablet once a day. Check BP  5. Further evaluation treatment? - Yes if recurrent

## 2015-05-22 NOTE — Progress Notes (Signed)
Dear Dr. Harrell Lark,  Thank you for referring [name] to the [facility] on 05/22/2015.   Below is a summation of this patient's evaluation and recommendations.  Thank you for your referral. I will keep you informed about this patient's response to treatment.   If you have any questions please to do hestitate to contact me.   Sincerely,  Jiles Prows, MD [place]   ______________________________________________________________________    NEW PATIENT NOTE  Referring Provider: Verdell Carmine., MD Primary Provider: Verdell Carmine., MD Date of office visit: 05/22/2015    Subjective:   Chief Complaint:  [name] (DOB: 1935-01-28) is a 80 y.o. female with a chief complaint of Allergic Reaction  who presents to the clinic on 05/22/2015 with the following problems:  HPI: [name] presents to this clinic in evaluation of a severe allergic reaction that occurred approximately 3 weeks ago. While driving in her car she started to develop hand itching and throat itching and then subsequently had lip edema and tongue swelling to the point where she couldn't swallow and couldn't speak correctly along with vomiting and diarrhea. EMT service was contacted and she was transported to the emergency room where she remained there for approximately 6 hours. At the end of 12 hours her entire reaction resolved. She never had any skin involvement or other respiratory tract involvement. There was no obvious provoking factor giving rise to this reaction. There was no new environmental exposure or medication or associated systemic or constitutional symptoms.  Past Medical History  Diagnosis Date  . High blood pressure   . High cholesterol   . Diabetes [facility])     Past Surgical History  Procedure Laterality Date  . Cholecystectomy    . Abdominal hernia repair  2017  . Cataract extraction w/ intraocular lens  implant    . Cardiac valve replacement        Medication List           acetaminophen 325 MG tablet  Commonly known as:  TYLENOL  Take 325 mg by mouth.     aspirin EC 81 MG tablet  Take 81 mg by mouth daily.     BAYER CONTOUR TEST test strip  Generic drug:  glucose blood     benazepril 20 MG tablet  Commonly known as:  LOTENSIN  Take 10 mg by mouth daily.     CLARITIN 10 MG tablet  Generic drug:  loratadine  Take 10 mg by mouth daily.     cyclobenzaprine 5 MG tablet  Commonly known as:  FLEXERIL  TAKE 1 TABLET BY MOUTH 2 TIMES A DAY AS NEEDED FOR MUSCLE SPASMS.     docusate sodium 100 MG capsule  Commonly known as:  COLACE  Take 100 mg by mouth.     ferrous sulfate 325 (65 FE) MG tablet  Take 325 mg by mouth.     glimepiride 1 MG tablet  Commonly known as:  AMARYL  Take 1 tablet by mouth daily with breakfast.     ibuprofen 200 MG tablet  Commonly known as:  ADVIL,MOTRIN  Take 600 mg by mouth.     magnesium oxide 400 MG tablet  Commonly known as:  MAG-OX  Take 400 mg by mouth daily.     metFORMIN 1000 MG tablet  Commonly known as:  GLUCOPHAGE  Take 1 tablet by mouth 2 (two) times daily.  metoprolol succinate 50 MG 24 hr tablet  Commonly known as:  TOPROL-XL  Take 1 tablet by mouth daily.     MULTI-VITAMINS Tabs  Take 1 tablet by mouth daily.     nitroGLYCERIN 0.4 MG SL tablet  Commonly known as:  NITROSTAT  Place 0.4 mg under the tongue as needed.     Omega-3 1000 MG Caps  Take 1 tablet by mouth 2 (two) times daily.     oxyCODONE 5 MG immediate release tablet  Commonly known as:  Oxy IR/ROXICODONE  TAKE 1 TABLET BY MOUTH EVERY 4 HOURS AS NEEDED FOR PAIN. FOR UP TO 7 DAYS     pantoprazole 40 MG tablet  Commonly known as:  PROTONIX  Take 1 tablet by mouth every morning.     potassium chloride 10 MEQ tablet  Commonly known as:  K-DUR,KLOR-CON  Take by mouth.     pravastatin 40 MG tablet  Commonly known as:  PRAVACHOL  Take 40 mg by  mouth daily.     promethazine 25 MG tablet  Commonly known as:  PHENERGAN  Take 25 mg by mouth.     spironolactone 25 MG tablet  Commonly known as:  ALDACTONE  Take 25 mg by mouth.     torsemide 20 MG tablet  Commonly known as:  DEMADEX  Take 1 tablet by mouth daily.     vitamin B-12 1000 MCG tablet  Commonly known as:  CYANOCOBALAMIN  Take 1 tablet by mouth daily.     VITAMIN D-1000 MAX ST 1000 units tablet  Generic drug:  Cholecalciferol  Take 1 tablet by mouth daily.        Allergies  Allergen Reactions  . Januvia [Sitagliptin] Itching and Swelling  . Keflex [Cephalexin] Itching and Swelling  . Tape Rash    Review of systems negative except as noted in HPI / PMHx or noted below:  Review of Systems  Constitutional: Negative.   HENT: Negative.   Eyes: Negative.   Respiratory: Negative.   Cardiovascular: Negative.   Gastrointestinal: Negative.   Genitourinary: Negative.   Musculoskeletal: Negative.   Skin: Negative.   Neurological: Negative.   Endo/Heme/Allergies: Negative.   Psychiatric/Behavioral: Negative.     Family History  Problem Relation Age of Onset  . High blood pressure Mother   . Diabetes Mother   . Congestive Heart Failure Mother   . Throat cancer Father   . Heart attack Father     Social History   Social History  . Marital Status: Widowed    Spouse Name: N/A  . Number of Children: N/A  . Years of Education: N/A   Occupational History  . Not on file.   Social History Main Topics  . Smoking status: Never Smoker   . Smokeless tobacco: Never Used  . Alcohol Use: Yes  . Drug Use: No  . Sexual Activity: Not on file   Other Topics Concern  . Not on file   Social History Narrative  . No narrative on file    Environmental and Social history  Lives in a house with a dry environment, no animals in household, carpet in bedroom, no plastic on bed or pillow, no smokers in house   Objective:   Filed Vitals:   05/22/15 0920  BP:  140/76  Pulse: 88  Temp: 98.4 F (36.9 C)  Resp: 22   Height: 4' 10.66" (149 cm) Weight: 191 lb 3.2 oz (86.728 kg)  Physical Exam  Constitutional: She is well-developed, well-nourished,  and in no distress.  HENT:  Head: Normocephalic.  Right Ear: Tympanic membrane, external ear and ear canal normal.  Left Ear: Tympanic membrane, external ear and ear canal normal.  Nose: Nose normal. No mucosal edema or rhinorrhea.  Mouth/Throat: Uvula is midline, oropharynx is clear and moist and mucous membranes are normal. No oropharyngeal exudate.  Eyes: Conjunctivae are normal.  Neck: Trachea normal. No tracheal tenderness present. No tracheal deviation present. No thyromegaly present.  Cardiovascular: Normal rate, regular rhythm, S1 normal, S2 normal and normal heart sounds.   No murmur heard. Pulmonary/Chest: Breath sounds normal. No stridor. No respiratory distress. She has no wheezes. She has no rales.  Musculoskeletal: She exhibits no edema.  Lymphadenopathy:       Head (right side): No tonsillar adenopathy present.       Head (left side): No tonsillar adenopathy present.    She has no cervical adenopathy.  Neurological: She is alert. Gait normal.  Skin: No rash noted. She is not diaphoretic. No erythema. Nails show no clubbing.  Psychiatric: Mood and affect normal.     Diagnostics: Allergy skin tests were performed. She did not demonstrate any hypersensitivity against a screening panel of foods.    Assessment and Plan:    1. Anaphylaxis, initial encounter   2. On angiotensin-converting enzyme (ACE) inhibitors     1. Allergen avoidance measures  2. EpiPen, Benadryl, M.D./ER for allergic reaction  3. Blood - CBC w/diff, CMP, alpha gal panel  4. Change Lotensin to losartan 50 mg one tablet once a day. Check BP  5. Further evaluation treatment? - Yes if recurrent  [name] obviously had a rather significant reaction involving multiple organ systems including skin, oral facial  cavity, and gut thus filling the diagnosis of anaphylaxis. It is not extremely obvious why she had this reaction but we'll have her obtain the blood tests mentioned above in investigation of systemic disease and alpha gal syndrome. ACE inhibitor use is contraindicated in this setting and thus we'll change her to losartan and she can follow-up with her primary care doctor concerning further management of her blood pressure issue. Certainly she will require further evaluation if her reactions or recurrent and she will contact me should she have another reaction in the future.  Jiles Prows, MD [facility]

## 2015-05-27 LAB — COMPREHENSIVE METABOLIC PANEL
ALT: 11 IU/L (ref 0–32)
AST: 10 IU/L (ref 0–40)
Albumin/Globulin Ratio: 1.5 (ref 1.2–2.2)
Albumin: 4 g/dL (ref 3.5–4.8)
Alkaline Phosphatase: 53 IU/L (ref 39–117)
BUN/Creatinine Ratio: 31 — ABNORMAL HIGH (ref 12–28)
BUN: 37 mg/dL — ABNORMAL HIGH (ref 8–27)
Bilirubin Total: 0.2 mg/dL (ref 0.0–1.2)
CO2: 23 mmol/L (ref 18–29)
Calcium: 9.6 mg/dL (ref 8.7–10.3)
Chloride: 102 mmol/L (ref 96–106)
Creatinine, Ser: 1.21 mg/dL — ABNORMAL HIGH (ref 0.57–1.00)
GFR calc Af Amer: 50 mL/min/{1.73_m2} — ABNORMAL LOW (ref >59)
GFR calc non Af Amer: 43 mL/min/{1.73_m2} — ABNORMAL LOW (ref >59)
Globulin, Total: 2.6 g/dL (ref 1.5–4.5)
Glucose: 123 mg/dL — ABNORMAL HIGH (ref 65–99)
Potassium: 5.3 mmol/L — ABNORMAL HIGH (ref 3.5–5.2)
Sodium: 139 mmol/L (ref 134–144)
Total Protein: 6.6 g/dL (ref 6.0–8.5)

## 2015-05-27 LAB — CBC WITH DIFFERENTIAL/PLATELET
Basophils Absolute: 0 10*3/uL (ref 0.0–0.2)
Basos: 0 %
EOS (ABSOLUTE): 0.1 10*3/uL (ref 0.0–0.4)
Eos: 3 %
Hematocrit: 36.9 % (ref 34.0–46.6)
Hemoglobin: 11.8 g/dL (ref 11.1–15.9)
Lymphocytes Absolute: 2.3 10*3/uL (ref 0.7–3.1)
Lymphs: 22 %
MCH: 26.8 pg (ref 26.6–33.0)
MCHC: 32 g/dL (ref 31.5–35.7)
MCV: 83 fL (ref 79–97)
Monocytes Absolute: 0.6 10*3/uL (ref 0.1–0.9)
Monocytes: 8 %
Neutrophils Absolute: 7.6 10*3/uL — ABNORMAL HIGH (ref 1.4–7.0)
Neutrophils: 67 %
Platelets: 349 10*3/uL (ref 150–379)
RBC: 4.44 x10E6/uL (ref 3.77–5.28)
RDW: 18.1 % — ABNORMAL HIGH (ref 12.3–15.4)
WBC: 10.6 10*3/uL (ref 3.4–10.8)

## 2015-05-27 LAB — ALPHA-GAL PANEL
Alpha Gal IgE*: 0.12 kU/L (ref <0.35)
Beef (Bos spp) IgE: <0.1 kU/L (ref <0.35)
Class Interpretation: 0
Class Interpretation: 0
Class Interpretation: 0
Lamb/Mutton (Ovis spp) IgE: <0.1 kU/L (ref <0.35)
Pork (Sus spp) IgE: <0.1 kU/L (ref <0.35)

## 2015-05-29 ENCOUNTER — Telehealth: Payer: Self-pay

## 2015-05-29 DIAGNOSIS — E875 Hyperkalemia: Secondary | ICD-10-CM

## 2015-05-29 NOTE — Telephone Encounter (Signed)
Called pt. Advised to p.u. Lab orders for Potassium.

## 2015-05-29 NOTE — Telephone Encounter (Signed)
Lab order sent to [facility] to P.U. Order.

## 2015-06-03 LAB — POTASSIUM: Potassium: 5.1 mmol/L (ref 3.5–5.2)

## 2015-07-23 ENCOUNTER — Other Ambulatory Visit: Payer: Self-pay | Admitting: Allergy and Immunology

## 2015-07-23 NOTE — Telephone Encounter (Signed)
Dr. Neldon Mc is it ok to refill Losartan?

## 2015-07-24 NOTE — Telephone Encounter (Signed)
Please refill this medication.

## 2015-07-29 ENCOUNTER — Other Ambulatory Visit: Payer: Self-pay | Admitting: Allergy and Immunology

## 2015-08-04 NOTE — Telephone Encounter (Signed)
I received 8 of these medication refills since the weekend please have settings corrected so I do not receive these messages.

## 2015-08-26 DIAGNOSIS — G4733 Obstructive sleep apnea (adult) (pediatric): Secondary | ICD-10-CM | POA: Insufficient documentation

## 2015-08-26 DIAGNOSIS — K219 Gastro-esophageal reflux disease without esophagitis: Secondary | ICD-10-CM | POA: Insufficient documentation

## 2015-08-26 DIAGNOSIS — D519 Vitamin B12 deficiency anemia, unspecified: Secondary | ICD-10-CM

## 2015-08-26 DIAGNOSIS — G4719 Other hypersomnia: Secondary | ICD-10-CM

## 2015-08-26 DIAGNOSIS — I517 Cardiomegaly: Secondary | ICD-10-CM

## 2015-08-26 DIAGNOSIS — E669 Obesity, unspecified: Secondary | ICD-10-CM

## 2015-08-26 DIAGNOSIS — I10 Essential (primary) hypertension: Secondary | ICD-10-CM | POA: Insufficient documentation

## 2015-08-26 DIAGNOSIS — M810 Age-related osteoporosis without current pathological fracture: Secondary | ICD-10-CM | POA: Insufficient documentation

## 2015-08-26 HISTORY — DX: Obesity, unspecified: E66.9

## 2015-08-26 HISTORY — DX: Obstructive sleep apnea (adult) (pediatric): G47.33

## 2015-08-26 HISTORY — DX: Other hypersomnia: G47.19

## 2015-08-26 HISTORY — DX: Essential (primary) hypertension: I10

## 2015-08-26 HISTORY — DX: Vitamin B12 deficiency anemia, unspecified: D51.9

## 2015-08-26 HISTORY — DX: Cardiomegaly: I51.7

## 2015-08-26 HISTORY — DX: Gastro-esophageal reflux disease without esophagitis: K21.9

## 2015-08-26 HISTORY — DX: Age-related osteoporosis without current pathological fracture: M81.0

## 2017-05-26 DIAGNOSIS — G8929 Other chronic pain: Secondary | ICD-10-CM

## 2017-05-26 DIAGNOSIS — Z8719 Personal history of other diseases of the digestive system: Secondary | ICD-10-CM

## 2017-05-26 DIAGNOSIS — R1032 Left lower quadrant pain: Secondary | ICD-10-CM | POA: Insufficient documentation

## 2017-05-26 HISTORY — DX: Other chronic pain: G89.29

## 2017-05-26 HISTORY — DX: Personal history of other diseases of the digestive system: Z87.19

## 2018-01-11 DIAGNOSIS — E11319 Type 2 diabetes mellitus with unspecified diabetic retinopathy without macular edema: Secondary | ICD-10-CM

## 2018-01-11 DIAGNOSIS — Z951 Presence of aortocoronary bypass graft: Secondary | ICD-10-CM

## 2018-01-11 HISTORY — DX: Type 2 diabetes mellitus with unspecified diabetic retinopathy without macular edema: E11.319

## 2018-01-11 HISTORY — DX: Presence of aortocoronary bypass graft: Z95.1

## 2018-03-19 NOTE — Progress Notes (Signed)
Cardiology Office Note:    Date:  03/20/2018   ID:  [name], DOB 1935/10/24, MRN [id number]  PCP:  Verdell Carmine., MD  Cardiologist:  Shirlee More, MD   Referring MD: Verdell Carmine., MD  ASSESSMENT:    1. CAD in native artery   2. H/O aortic valve replacement with tissue graft   3. Chronic diastolic heart failure ([place])   4. CKD (chronic kidney disease) stage 3, GFR 30-59 ml/min (HCC)   5. Dyslipidemia    PLAN:    In order of problems listed above:  1. Stable CAD New [place] Heart Association class I having no angina on current treatment continue aspirin statin beta-blocker at this time I would not advise her to have an ischemia evaluation 2. Approaching 10 years after surgery check echocardiogram with concern of prosthetic valve dysfunction 3. Stable compensated New York Heart Association class I she has a little bit of edema her weight is fluctuating we will increase her diuretic subtly. 4. Stable followed by her PCP continue her current distal and loop diuretic 5. Stable continue her statin her lipids are optimal  Next appointment 1 year   Medication Adjustments/Labs and Tests Ordered: Current medicines are reviewed at length with the patient today.  Concerns regarding medicines are outlined above.  No orders of the defined types were placed in this encounter.  No orders of the defined types were placed in this encounter.    Chief Complaint  Patient presents with  . New Patient (Initial Visit)    no concerns just to re-establish care   . Coronary Artery Disease    with CABG and SAVR 2011  . Hypertension  . Chronic Kidney Disease    History of Present Illness:    [name] is a 83 y.o. female who is being seen today for the evaluation of valvular heart disease with bioprosthetic aortic valve replacement CAD heart failure hypertension with CKD and frequent PVCs at the request of Spry, Marsh Dolly., MD. She was last seen 12/31/14 at [facility] cardi[facility]. She had coronary  bypass surgery and aortic valve replacement March 2011 with a #21 Edwards magna ease bovine AVR. Other problems include type 2 diabetes mellitus with peripheral vascular complication.  Past Medical History:  Diagnosis Date  . Diabetes ([place])   . High blood pressure   . High cholesterol     Past Surgical History:  Procedure Laterality Date  . ABDOMINAL HERNIA REPAIR  2017  . CARDIAC VALVE REPLACEMENT    . CATARACT EXTRACTION W/ INTRAOCULAR LENS IMPLANT    . CHOLECYSTECTOMY      Current Medications: Current Meds  Medication Sig  . aspirin EC 81 MG tablet Take 81 mg by mouth daily.  . Cholecalciferol (VITAMIN D-1000 MAX ST) 1000 units tablet Take 1 tablet by mouth daily.  [name] EPIPEN 2-PAK 0.3 MG/0.3ML SOAJ injection INJECT INTRAMUSCULARLY AS DIRECTED  . ferrous sulfate 325 (65 FE) MG tablet Take 325 mg by mouth daily.   [name] glimepiride (AMARYL) 1 MG tablet Take 1 tablet by mouth daily with breakfast.  . glucose blood (BAYER CONTOUR TEST) test strip   . losartan (COZAAR) 50 MG tablet TAKE 1 TABLET BY MOUTH EVERY DAY  . magnesium oxide (MAG-OX) 400 MG tablet Take 400 mg by mouth daily.  . metFORMIN (GLUCOPHAGE) 1000 MG tablet Take 1 tablet by mouth 2 (two) times daily.  . metoprolol succinate (TOPROL-XL) 50 MG 24 hr tablet Take 1 tablet by mouth daily.  . Multiple Vitamin (  MULTI-VITAMINS) TABS Take 1 tablet by mouth daily.  . nitroGLYCERIN (NITROSTAT) 0.4 MG SL tablet Place 0.4 mg under the tongue as needed.  . Omega-3 1000 MG CAPS Take 1 tablet by mouth 2 (two) times daily.  . potassium chloride (K-DUR,KLOR-CON) 10 MEQ tablet Take 10 mEq by mouth daily.   . pravastatin (PRAVACHOL) 40 MG tablet Take 40 mg by mouth daily.  [name] spironolactone (ALDACTONE) 25 MG tablet Take 25 mg by mouth daily.   [name] torsemide (DEMADEX) 20 MG tablet Take 1 tablet by mouth daily.  . vitamin B-12 (CYANOCOBALAMIN) 1000 MCG tablet Take 1 tablet by mouth daily.  . [DISCONTINUED] acetaminophen (TYLENOL) 325 MG tablet  Take 325 mg by mouth.  . [DISCONTINUED] loratadine (CLARITIN) 10 MG tablet Take 10 mg by mouth daily.     Allergies:   Januvia [sitagliptin]; Keflex [cephalexin]; and Tape   Social History   Socioeconomic History  . Marital status: Widowed    Spouse name: Not on file  . Number of children: Not on file  . Years of education: Not on file  . Highest education level: Not on file  Occupational History  . Not on file  Social Needs  . Financial resource strain: Not on file  . Food insecurity:    Worry: Not on file    Inability: Not on file  . Transportation needs:    Medical: Not on file    Non-medical: Not on file  Tobacco Use  . Smoking status: Never Smoker  . Smokeless tobacco: Never Used  Substance and Sexual Activity  . Alcohol use: Yes  . Drug use: No  . Sexual activity: Not on file  Lifestyle  . Physical activity:    Days per week: Not on file    Minutes per session: Not on file  . Stress: Not on file  Relationships  . Social connections:    Talks on phone: Not on file    Gets together: Not on file    Attends religious service: Not on file    Active member of club or organization: Not on file    Attends meetings of clubs or organizations: Not on file    Relationship status: Not on file  Other Topics Concern  . Not on file  Social History Narrative  . Not on file     Family History: The patient's family history includes Congestive Heart Failure in her mother; Diabetes in her mother; Heart attack in her father; High blood pressure in her mother; Throat cancer in her father.  ROS:   ROS Please see the history of present illness.     All other systems reviewed and are negative.  EKGs/Labs/Other Studies Reviewed:    The following studies were reviewed today:   EKG:  EKG is  ordered today.  The ekg ordered today is personally reviewed and demonstrates sinus rhythm right bundle branch block left anterior hemiblock  Recent Labs:   02/02/2018 hemoglobin A1c 6.6  creatinine 1.26 GFR 40 cc/min potassium 4.9 liver function normal.  Cholesterol 118 triglyceride 214 HDL 36 LDL 54.   Physical Exam:    VS:  BP 110/70   Pulse 73   Ht 5' (1.524 m)   Wt 186 lb 6.4 oz (84.6 kg)   SpO2 98%   BMI 36.40 kg/m     Wt Readings from Last 3 Encounters:  03/20/18 186 lb 6.4 oz (84.6 kg)  05/22/15 191 lb 3.2 oz (86.7 kg)     GEN: Obese BMI  47 well nourished, well developed in no acute distress HEENT: Normal NECK: No JVD; No carotid bruits LYMPHATICS: No lymphadenopathy CARDIAC: Short closing sound grade 1/6 to 2/6 ejection murmur aortic area only mid peaks no AR full carotids RRR, no murmurs, rubs, gallops RESPIRATORY:  Clear to auscultation without rales, wheezing or rhonchi  ABDOMEN: Soft, non-tender, non-distended MUSCULOSKELETAL: Bilateral 1+ ankle edema; No deformity  SKIN: Warm and dry NEUROLOGIC:  Alert and oriented x 3 PSYCHIATRIC:  Normal affect     Signed, Shirlee More, MD  03/20/2018 2:12 PM    [facility]

## 2018-03-20 ENCOUNTER — Encounter: Payer: Self-pay | Admitting: Cardiology

## 2018-03-20 ENCOUNTER — Ambulatory Visit: Payer: Medicare Other | Admitting: Cardiology

## 2018-03-20 VITALS — BP 110/70 | HR 73 | Ht 60.0 in | Wt 186.4 lb

## 2018-03-20 DIAGNOSIS — I2510 Atherosclerotic heart disease of native coronary artery without angina pectoris: Secondary | ICD-10-CM | POA: Diagnosis not present

## 2018-03-20 DIAGNOSIS — I5032 Chronic diastolic (congestive) heart failure: Secondary | ICD-10-CM

## 2018-03-20 DIAGNOSIS — N183 Chronic kidney disease, stage 3 unspecified: Secondary | ICD-10-CM

## 2018-03-20 DIAGNOSIS — Z954 Presence of other heart-valve replacement: Secondary | ICD-10-CM

## 2018-03-20 DIAGNOSIS — E785 Hyperlipidemia, unspecified: Secondary | ICD-10-CM

## 2018-03-20 HISTORY — DX: Hyperlipidemia, unspecified: E78.5

## 2018-03-20 MED ORDER — TORSEMIDE 20 MG PO TABS: ORAL_TABLET | ORAL | 3 refills | Status: DC

## 2018-03-20 MED ORDER — TORSEMIDE 20 MG PO TABS: ORAL_TABLET | ORAL | 11 refills | Status: DC

## 2018-03-20 NOTE — Addendum Note (Signed)
Addended by: Stevan Born on: 03/20/2018 02:37 PM   Modules accepted: Orders

## 2018-03-20 NOTE — Patient Instructions (Signed)
Medication Instructions:  Your physician has recommended you make the following change in your medication:   INCREASE: torsemide 20mg  one tablet daily in the morning, and take an extra 20mg  on Monday, Wednesday, and Friday  If you need a refill on your cardiac medications before your next appointment, please call your pharmacy.   Lab work: NONE If you have labs (blood work) drawn today and your tests are completely normal, you will receive your results only by: [name] MyChart Message (if you have MyChart) OR . A paper copy in the mail If you have any lab test that is abnormal or we need to change your treatment, we will call you to review the results.  Testing/Procedures: Your physician has requested that you have an echocardiogram. Echocardiography is a painless test that uses sound waves to create images of your heart. It provides your doctor with information about the size and shape of your heart and how well your heart's chambers and valves are working. This procedure takes approximately one hour. There are no restrictions for this procedure.    Follow-Up: At [facility], you and your health needs are our priority.  As part of our continuing mission to provide you with exceptional heart care, we have created designated Provider Care Teams.  These Care Teams include your primary Cardiologist (physician) and Advanced Practice Providers (APPs -  Physician Assistants and Nurse Practitioners) who all work together to provide you with the care you need, when you need it. You will need a follow up appointment in 1 years.

## 2018-03-20 NOTE — Addendum Note (Signed)
Addended by: Stevan Born on: 03/20/2018 02:44 PM   Modules accepted: Orders

## 2018-04-11 ENCOUNTER — Telehealth: Payer: Self-pay | Admitting: Cardiology

## 2018-04-11 NOTE — Telephone Encounter (Signed)
[id number] °

## 2018-04-24 ENCOUNTER — Other Ambulatory Visit: Payer: Medicare Other

## 2018-11-23 DIAGNOSIS — M1A2790 Drug-induced chronic gout, unspecified ankle and foot, without tophus (tophi): Secondary | ICD-10-CM

## 2018-11-23 HISTORY — DX: Drug-induced chronic gout, unspecified ankle and foot, without tophus (tophi): M1A.2790

## 2019-02-19 ENCOUNTER — Other Ambulatory Visit: Payer: Self-pay | Admitting: Cardiology

## 2019-03-25 DIAGNOSIS — E113299 Type 2 diabetes mellitus with mild nonproliferative diabetic retinopathy without macular edema, unspecified eye: Secondary | ICD-10-CM | POA: Insufficient documentation

## 2019-03-25 HISTORY — DX: Type 2 diabetes mellitus with mild nonproliferative diabetic retinopathy without macular edema, unspecified eye: E11.3299

## 2019-04-06 ENCOUNTER — Other Ambulatory Visit: Payer: Self-pay

## 2019-04-06 ENCOUNTER — Ambulatory Visit (INDEPENDENT_AMBULATORY_CARE_PROVIDER_SITE_OTHER): Payer: Medicare Other

## 2019-04-06 ENCOUNTER — Encounter: Payer: Self-pay | Admitting: Cardiology

## 2019-04-06 ENCOUNTER — Ambulatory Visit: Payer: Medicare Other | Admitting: Cardiology

## 2019-04-06 VITALS — BP 126/64 | HR 64 | Ht 60.0 in | Wt 146.0 lb

## 2019-04-06 DIAGNOSIS — I2510 Atherosclerotic heart disease of native coronary artery without angina pectoris: Secondary | ICD-10-CM

## 2019-04-06 DIAGNOSIS — Z954 Presence of other heart-valve replacement: Secondary | ICD-10-CM | POA: Diagnosis not present

## 2019-04-06 DIAGNOSIS — I5032 Chronic diastolic (congestive) heart failure: Secondary | ICD-10-CM | POA: Diagnosis not present

## 2019-04-06 DIAGNOSIS — R002 Palpitations: Secondary | ICD-10-CM

## 2019-04-06 DIAGNOSIS — E785 Hyperlipidemia, unspecified: Secondary | ICD-10-CM | POA: Diagnosis not present

## 2019-04-06 DIAGNOSIS — I714 Abdominal aortic aneurysm, without rupture, unspecified: Secondary | ICD-10-CM

## 2019-04-06 MED ORDER — TORSEMIDE 20 MG PO TABS: 20.0000 mg | ORAL_TABLET | Freq: Two times a day (BID) | ORAL | 0 refills | Status: DC

## 2019-04-06 NOTE — Progress Notes (Signed)
Cardiology Office Note:    Date:  04/06/2019   ID:  [name], DOB 02-Jul-1935, MRN [id number]  PCP:  Verdell Carmine., MD  Cardiologist:  Shirlee More, MD    Referring MD: Verdell Carmine., MD    ASSESSMENT:    1. H/O aortic valve replacement with tissue graft   2. CAD in native artery   3. Chronic diastolic heart failure ([place])   4. Dyslipidemia    PLAN:    In order of problems listed above:  1. Echocardiogram 10 years remote from AVR clinically does not have valve dysfunction continue aspirin for stroke prophylaxis 2. Stable CAD New [place] Heart Association class I continue her present therapy and at this time would not advise an ischemia evaluation 3. Mildly decompensated she will increase her diuretic 4. Continue current lipid-lowering treatment LDL at target 5. Bifascicular heart block/first-degree AV block check 3-day ZIO monitor if syncope might need pacemaker   Next appointment: 6 months   Medication Adjustments/Labs and Tests Ordered: Current medicines are reviewed at length with the patient today.  Concerns regarding medicines are outlined above.  No orders of the defined types were placed in this encounter.  No orders of the defined types were placed in this encounter.   Chief Complaint  Patient presents with  . Follow-up    With AVR and CABG 2011  . Coronary Artery Disease  . Hypertension  . Hyperlipidemia    History of Present Illness:    [name] is a 84 y.o. female with a hx of bioprosthetic aortic valve replacement CAD heart failure hypertension with CKD and frequent PVCs last seen 03/20/2018.She had coronary bypass surgery and aortic valve replacement March 2011 with a #21 Edwards magna ease bovine AVR. Other problems include type 2 diabetes mellitus with peripheral vascular disease An echo cardiogram was ordered last visit not performed Compliance with diet, lifestyle and medications: Yes  In general she has done well however she has increased  edema on the day increase his diuretic dosage she has stasis changes advised to use moisturizing Eucerin on her legs daily.  Her EKG shows trifascicular heart block which she is not having syncope or near syncope we will apply a 3-day ZIO monitor I have to see her more frequently and if she had an episode of syncope she will need a permanent pacemaker.  No orthopnea chest pain palpitation.  She is overdue will have her echocardiogram 10 years remote from her aortic valve replacement. Past Medical History:  Diagnosis Date  . Diabetes ([place])   . High blood pressure   . High cholesterol     Past Surgical History:  Procedure Laterality Date  . ABDOMINAL HERNIA REPAIR  2017  . CARDIAC VALVE REPLACEMENT    . CATARACT EXTRACTION W/ INTRAOCULAR LENS IMPLANT    . CHOLECYSTECTOMY      Current Medications: Current Meds  Medication Sig  . aspirin EC 81 MG tablet Take 81 mg by mouth daily.  . Cholecalciferol (VITAMIN D-1000 MAX ST) 1000 units tablet Take 1 tablet by mouth daily.  [name] EPIPEN 2-PAK 0.3 MG/0.3ML SOAJ injection INJECT INTRAMUSCULARLY AS DIRECTED  . ferrous sulfate 325 (65 FE) MG tablet Take 325 mg by mouth daily.   [name] glucose blood (BAYER CONTOUR TEST) test strip   . losartan (COZAAR) 50 MG tablet TAKE 1 TABLET BY MOUTH EVERY DAY  . magnesium oxide (MAG-OX) 400 MG tablet Take 400 mg by mouth daily.  . metFORMIN (GLUCOPHAGE) 1000 MG tablet Take  1 tablet by mouth daily.   . metoprolol succinate (TOPROL-XL) 50 MG 24 hr tablet Take 1 tablet by mouth daily.  . Multiple Vitamin (MULTI-VITAMINS) TABS Take 1 tablet by mouth daily.  . nitroGLYCERIN (NITROSTAT) 0.4 MG SL tablet Place 0.4 mg under the tongue as needed.  . Omega-3 1000 MG CAPS Take 1 tablet by mouth 2 (two) times daily.  . potassium chloride (K-DUR,KLOR-CON) 10 MEQ tablet Take 10 mEq by mouth daily.   . pravastatin (PRAVACHOL) 40 MG tablet Take 40 mg by mouth daily.  [name] spironolactone (ALDACTONE) 25 MG tablet Take 25 mg by mouth  daily.   [name] torsemide (DEMADEX) 20 MG tablet TAKE 1 TABLET BY MOUTH  EVERY MORNING AND 1 TABLET  IN THE AFTERNOON ON MONDAY, WEDNESDAY, AND FRIDAY  . vitamin B-12 (CYANOCOBALAMIN) 1000 MCG tablet Take 1 tablet by mouth daily.     Allergies:   Januvia [sitagliptin], Keflex [cephalexin], and Tape   Social History   Socioeconomic History  . Marital status: Widowed    Spouse name: Not on file  . Number of children: Not on file  . Years of education: Not on file  . Highest education level: Not on file  Occupational History  . Not on file  Tobacco Use  . Smoking status: Never Smoker  . Smokeless tobacco: Never Used  Substance and Sexual Activity  . Alcohol use: Yes  . Drug use: No  . Sexual activity: Not on file  Other Topics Concern  . Not on file  Social History Narrative  . Not on file   Social Determinants of Health   Financial Resource Strain:   . Difficulty of Paying Living Expenses:   Food Insecurity:   . Worried About Charity fundraiser in the Last Year:   . Arboriculturist in the Last Year:   Transportation Needs:   . Film/video editor (Medical):   [name] Lack of Transportation (Non-Medical):   Physical Activity:   . Days of Exercise per Week:   . Minutes of Exercise per Session:   Stress:   . Feeling of Stress :   Social Connections:   . Frequency of Communication with Friends and Family:   . Frequency of Social Gatherings with Friends and Family:   . Attends Religious Services:   . Active Member of Clubs or Organizations:   . Attends Archivist Meetings:   [name] Marital Status:      Family History: The patient's family history includes Congestive Heart Failure in her mother; Diabetes in her mother; Heart attack in her father; High blood pressure in her mother; Throat cancer in her father. ROS:   Please see the history of present illness.    All other systems reviewed and are negative.  EKGs/Labs/Other Studies Reviewed:    The following studies  were reviewed today:  EKG:  EKG ordered today and personally reviewed.  The ekg ordered today demonstrates right bundle branch block left anterior hemiblock first-degree AV block PR interval 300 ms  Recent Labs: 03/12/2019 Gl[place]in A1c normal 5.6% CMP normal except for glucose 118 normal liver function creatinine 1.02 serum potassium 4.8 her GFR 52 cc.  Stage II CKD LDL 56 non-HDL cholesterol 74 11/20/2018 hemoglobin 10.8  Physical Exam:    VS:  BP 126/64   Pulse 64   Ht 5' (1.524 m)   Wt 146 lb (66.2 kg)   SpO2 100%   BMI 28.51 kg/m     Wt Readings from  Last 3 Encounters:  04/06/19 146 lb (66.2 kg)  03/20/18 186 lb 6.4 oz (84.6 kg)  05/22/15 191 lb 3.2 oz (86.7 kg)     GEN: Appears her age well nourished, well developed in no acute distress HEENT: Normal NECK: No JVD; No carotid bruits LYMPHATICS: No lymphadenopathy CARDIAC: 1 of 6 flow murmur no AR no findings of valve dysfunction RRR, no  rubs, gallops RESPIRATORY:  Clear to auscultation without rales, wheezing or rhonchi  ABDOMEN: Soft, non-tender, non-distended MUSCULOSKELETAL:  No edema; No deformity  SKIN: Warm and dry NEUROLOGIC:  Alert and oriented x 3 PSYCHIATRIC:  Normal affect    Signed, Shirlee More, MD  04/06/2019 11:20 AM    [place]

## 2019-04-06 NOTE — Patient Instructions (Signed)
Medication Instructions:  Your physician has recommended you make the following change in your medication:  INCREASE: Torsemide 20 mg take one tablet by mouth twice daily.  *If you need a refill on your cardiac medications before your next appointment, please call your pharmacy*   Lab Work: None If you have labs (blood work) drawn today and your tests are completely normal, you will receive your results only by: [name] MyChart Message (if you have MyChart) OR . A paper copy in the mail If you have any lab test that is abnormal or we need to change your treatment, we will call you to review the results.   Testing/Procedures: Your physician has requested that you have an echocardiogram. Echocardiography is a painless test that uses sound waves to create images of your heart. It provides your doctor with information about the size and shape of your heart and how well your heart's chambers and valves are working. This procedure takes approximately one hour. There are no restrictions for this procedure.  A zio monitor was ordered today. It will remain on for 3 days. You will then return monitor and event diary in provided box. It takes 1-2 weeks for report to be downloaded and returned to [place]. We will call you with the results. If monitor falls off or has orange flashing light, please call Zio for further instructions.     Follow-Up: At [facility], you and your health needs are our priority.  As part of our continuing mission to provide you with exceptional heart care, we have created designated Provider Care Teams.  These Care Teams include your primary Cardiologist (physician) and Advanced Practice Providers (APPs -  Physician Assistants and Nurse Practitioners) who all work together to provide you with the care you need, when you need it.  We recommend signing up for the patient portal called "MyChart".  Sign up information is provided on this After Visit Summary.  MyChart is used to connect with  patients for Virtual Visits (Telemedicine).  Patients are able to view lab/test results, encounter notes, upcoming appointments, etc.  Non-urgent messages can be sent to your provider as well.   To learn more about what you can do with MyChart, go to [email or web address].    Your next appointment:   6 month(s)  The format for your next appointment:   In Person  Provider:   Shirlee More, MD   Other Instructions Please use a good moisturizer on your legs to help with the redness.

## 2019-04-24 ENCOUNTER — Telehealth: Payer: Self-pay

## 2019-04-24 NOTE — Telephone Encounter (Signed)
Spoke with patient regarding results and recommendation.  Patient verbalizes understanding and is agreeable to plan of care. Advised patient to call back with any issues or concerns.  

## 2019-04-24 NOTE — Telephone Encounter (Signed)
-----   Message from Richardo Priest, MD sent at 04/24/2019  9:40 AM EDT -----  stable result  This was done to guide if she may need a pacemaker. She is not having slow heart rhythms. She did have an episode of rapid heart rates I had like her to continue taking metoprolol and if she notices this happen frequently to let me know for additional medication.

## 2019-05-11 ENCOUNTER — Other Ambulatory Visit: Payer: Self-pay

## 2019-05-11 ENCOUNTER — Ambulatory Visit (INDEPENDENT_AMBULATORY_CARE_PROVIDER_SITE_OTHER): Payer: Medicare Other

## 2019-05-11 DIAGNOSIS — Z954 Presence of other heart-valve replacement: Secondary | ICD-10-CM

## 2019-05-11 NOTE — Progress Notes (Signed)
Complete echocardiogram has been performed.  Jimmy Mariah Harn RDCS, RVT 

## 2019-05-14 ENCOUNTER — Telehealth: Payer: Self-pay

## 2019-05-14 NOTE — Telephone Encounter (Signed)
Spoke with patient regarding results.  Patient verbalizes understanding and is agreeable to plan of care. Advised patient to call back with any issues or concerns.  

## 2019-05-14 NOTE — Telephone Encounter (Signed)
-----   Message from Richardo Priest, MD sent at 05/11/2019 12:16 PM EDT ----- Normal or stable result  Normal valve function

## 2019-05-24 ENCOUNTER — Other Ambulatory Visit: Payer: Self-pay | Admitting: Cardiology

## 2019-09-26 DIAGNOSIS — E7800 Pure hypercholesterolemia, unspecified: Secondary | ICD-10-CM | POA: Insufficient documentation

## 2019-09-26 DIAGNOSIS — I10 Essential (primary) hypertension: Secondary | ICD-10-CM | POA: Insufficient documentation

## 2019-09-26 DIAGNOSIS — E119 Type 2 diabetes mellitus without complications: Secondary | ICD-10-CM | POA: Insufficient documentation

## 2019-09-27 ENCOUNTER — Encounter: Payer: Self-pay | Admitting: Cardiology

## 2019-09-27 ENCOUNTER — Ambulatory Visit: Payer: Medicare Other | Admitting: Cardiology

## 2019-09-27 ENCOUNTER — Other Ambulatory Visit: Payer: Self-pay

## 2019-09-27 VITALS — BP 112/64 | HR 62 | Ht 60.0 in | Wt 142.6 lb

## 2019-09-27 DIAGNOSIS — I5032 Chronic diastolic (congestive) heart failure: Secondary | ICD-10-CM

## 2019-09-27 DIAGNOSIS — I493 Ventricular premature depolarization: Secondary | ICD-10-CM

## 2019-09-27 DIAGNOSIS — E785 Hyperlipidemia, unspecified: Secondary | ICD-10-CM | POA: Diagnosis not present

## 2019-09-27 DIAGNOSIS — Z954 Presence of other heart-valve replacement: Secondary | ICD-10-CM

## 2019-09-27 DIAGNOSIS — I2510 Atherosclerotic heart disease of native coronary artery without angina pectoris: Secondary | ICD-10-CM

## 2019-09-27 DIAGNOSIS — N183 Chronic kidney disease, stage 3 unspecified: Secondary | ICD-10-CM

## 2019-09-27 NOTE — Patient Instructions (Signed)

## 2019-09-27 NOTE — Progress Notes (Signed)
Cardiology Office Note:    Date:  09/27/2019   ID:  [name], DOB Jun 10, 1935, MRN [id number]  PCP:  Verdell Carmine., MD  Cardiologist:  Shirlee More, MD    Referring MD: Verdell Carmine., MD    ASSESSMENT:    1. H/O aortic valve replacement with tissue graft   2. CAD in native artery   3. Chronic diastolic heart failure ([place])   4. Dyslipidemia   5. Stage 3 chronic kidney disease, unspecified whether stage 3a or 3b CKD   6. Frequent PVCs    PLAN:    In order of problems listed above:  1. She has done well following aortic valve replacement normal function echocardiogram is on aspirin for antithrombotic therapy and is stable 2. Stable no angina on current medical treatment at this time I would do an ischemia evaluation 3. Stable compensated continue current loop distal diuretic 4. Lipids are ideal continue her statin low intensity 5. Stable CKD 6. Asymptomatic she will continue beta-blocker I do not think she requires an antiarrhythmic drug   Next appointment: 1 year   Medication Adjustments/Labs and Tests Ordered: Current medicines are reviewed at length with the patient today.  Concerns regarding medicines are outlined above.  No orders of the defined types were placed in this encounter.  No orders of the defined types were placed in this encounter.   Chief Complaint  Patient presents with  . Follow-up    Aortic valve replacement and frequent PVCs  . Coronary Artery Disease  . Hypertension    History of Present Illness:    [name] is a 84 y.o. female with a hx of  bioprosthetic aortic valve replacement CAD heart failure hypertension with CKD and frequent PVCs last seen 03/20/2018.She had coronary bypass surgery and aortic valve replacement March 2011 with a #21 Edwards magna ease bovine AVR. Other problems include type 2 diabetes mellitus with peripheral vascular disease She was last seen 04/06/2019.  Echocardiogram done after that visit showed normal tissue  aortic valve bioprosthetic function the left ventricular ejection fraction 60 to 65% with normal filling pressures moderate left atrial mild right atrial enlargement.  She also had a ZIO monitor performed for 3 days which showed SVT and no bradycardia.  Compliance with diet, lifestyle and medications: Yes  A nice opportunity to review her testing normal tissue aortic valve function and her ZIO monitor note showed no significant bradycardia.  Takes a beta-blocker and is having no symptomatic episodes of rapid heart rhythm she is pleased with the quality of her life and has no chest pain edema palpitation or syncope and tolerates her medications including statin without muscle pain or weakness. Past Medical History:  Diagnosis Date  . Angina pectoris ([place]) 12/30/2014  . Beat, premature ventricular 12/30/2014  . CAD in native artery 12/30/2014  . Cardiorenal disease 12/30/2014  . Chronic diastolic heart failure ([place]) 12/30/2014  . CKD (chronic kidney disease) stage 3, GFR 30-59 ml/min 12/30/2014  . Diabetes ([place])   . Dyslipidemia 03/20/2018  . H/O aortic valve replacement with tissue graft 12/30/2014   Overview:  S/P AVR with tissue valve, March 2011, #21 [facility] Ease bovine and CABG  . High blood pressure   . High cholesterol   . Incarcerated incisional hernia 05/14/2015  . Type 2 diabetes mellitus with peripheral angiopathy ([place]) 04/24/2015    Past Surgical History:  Procedure Laterality Date  . ABDOMINAL HERNIA REPAIR  2017  . CARDIAC VALVE REPLACEMENT    .  CATARACT EXTRACTION W/ INTRAOCULAR LENS IMPLANT    . CHOLECYSTECTOMY      Current Medications: Current Meds  Medication Sig  . allopurinol (ZYLOPRIM) 100 MG tablet Take 100 mg by mouth daily.  [name] aspirin EC 81 MG tablet Take 81 mg by mouth daily.  . Cholecalciferol (VITAMIN D-1000 MAX ST) 1000 units tablet Take 1 tablet by mouth daily.  [name] EPIPEN 2-PAK 0.3 MG/0.3ML SOAJ injection INJECT INTRAMUSCULARLY AS DIRECTED  . ferrous  sulfate 325 (65 FE) MG tablet Take 325 mg by mouth daily.   [name] glucose blood (BAYER CONTOUR TEST) test strip   . losartan (COZAAR) 50 MG tablet TAKE 1 TABLET BY MOUTH EVERY DAY  . magnesium oxide (MAG-OX) 400 MG tablet Take 400 mg by mouth daily.  . metoprolol succinate (TOPROL-XL) 50 MG 24 hr tablet Take 1 tablet by mouth daily.  . Multiple Vitamin (MULTI-VITAMINS) TABS Take 1 tablet by mouth daily.  . nitroGLYCERIN (NITROSTAT) 0.4 MG SL tablet Place 0.4 mg under the tongue as needed.  . Omega-3 1000 MG CAPS Take 1 tablet by mouth 2 (two) times daily.  . potassium chloride (K-DUR,KLOR-CON) 10 MEQ tablet Take 10 mEq by mouth daily.   . pravastatin (PRAVACHOL) 40 MG tablet Take 40 mg by mouth daily.  [name] spironolactone (ALDACTONE) 25 MG tablet Take 25 mg by mouth daily.   [name] tobramycin (TOBREX) 0.3 % ophthalmic solution Place 1 drop into both eyes as needed.  . torsemide (DEMADEX) 20 MG tablet TAKE 1 TABLET BY MOUTH  TWICE DAILY  . vitamin B-12 (CYANOCOBALAMIN) 1000 MCG tablet Take 1 tablet by mouth daily.     Allergies:   Januvia [sitagliptin], Keflex [cephalexin], and Tape   Social History   Socioeconomic History  . Marital status: Widowed    Spouse name: Not on file  . Number of children: Not on file  . Years of education: Not on file  . Highest education level: Not on file  Occupational History  . Not on file  Tobacco Use  . Smoking status: Never Smoker  . Smokeless tobacco: Never Used  Substance and Sexual Activity  . Alcohol use: Yes  . Drug use: No  . Sexual activity: Not on file  Other Topics Concern  . Not on file  Social History Narrative  . Not on file   Social Determinants of Health   Financial Resource Strain:   . Difficulty of Paying Living Expenses: Not on file  Food Insecurity:   . Worried About Charity fundraiser in the Last Year: Not on file  . Ran Out of Food in the Last Year: Not on file  Transportation Needs:   . Lack of Transportation (Medical): Not  on file  . Lack of Transportation (Non-Medical): Not on file  Physical Activity:   . Days of Exercise per Week: Not on file  . Minutes of Exercise per Session: Not on file  Stress:   . Feeling of Stress : Not on file  Social Connections:   . Frequency of Communication with Friends and Family: Not on file  . Frequency of Social Gatherings with Friends and Family: Not on file  . Attends Religious Services: Not on file  . Active Member of Clubs or Organizations: Not on file  . Attends Archivist Meetings: Not on file  . Marital Status: Not on file     Family History: The patient's family history includes Congestive Heart Failure in her mother; Diabetes in her mother; Heart attack  in her father; High blood pressure in her mother; Throat cancer in her father. ROS:   Please see the history of present illness.    All other systems reviewed and are negative.  EKGs/Labs/Other Studies Reviewed:    The following studies were reviewed today:   Recent Labs: 07/23/2019 cholesterol 111 LDL 57 triglycerides 80 HDL 37 non-HDL 74 all are at target Potassium 4.4 creatinine 1.25 440 cc  Physical Exam:    VS:  BP 112/64   Pulse 62   Ht 5' (1.524 m)   Wt 142 lb 9.6 oz (64.7 kg)   SpO2 99%   BMI 27.85 kg/m     Wt Readings from Last 3 Encounters:  09/27/19 142 lb 9.6 oz (64.7 kg)  04/06/19 146 lb (66.2 kg)  03/20/18 186 lb 6.4 oz (84.6 kg)     GEN:  Well nourished, well developed in no acute distress HEENT: Normal NECK: No JVD; No carotid bruits LYMPHATICS: No lymphadenopathy CARDIAC: 1 of 6 early peaking midsystolic localized aortic area ejection murmur no aortic regurgitation RRR, no  rubs, gallops RESPIRATORY:  Clear to auscultation without rales, wheezing or rhonchi  ABDOMEN: Soft, non-tender, non-distended MUSCULOSKELETAL:  No edema; No deformity  SKIN: Warm and dry NEUROLOGIC:  Alert and oriented x 3 PSYCHIATRIC:  Normal affect    Signed, Shirlee More, MD    09/27/2019 11:10 AM    [place]

## 2019-11-27 DIAGNOSIS — M109 Gout, unspecified: Secondary | ICD-10-CM

## 2019-11-27 HISTORY — DX: Gout, unspecified: M10.9

## 2020-01-12 DIAGNOSIS — I509 Heart failure, unspecified: Secondary | ICD-10-CM | POA: Insufficient documentation

## 2020-01-12 DIAGNOSIS — I082 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: Secondary | ICD-10-CM | POA: Insufficient documentation

## 2020-01-12 DIAGNOSIS — I6523 Occlusion and stenosis of bilateral carotid arteries: Secondary | ICD-10-CM | POA: Insufficient documentation

## 2020-01-12 HISTORY — DX: Occlusion and stenosis of bilateral carotid arteries: I65.23

## 2020-01-12 HISTORY — DX: Heart failure, unspecified: I50.9

## 2020-01-12 HISTORY — DX: 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: I08.2

## 2020-02-06 ENCOUNTER — Other Ambulatory Visit: Payer: Self-pay | Admitting: Cardiology

## 2020-02-13 DIAGNOSIS — K5790 Diverticulosis of intestine, part unspecified, without perforation or abscess without bleeding: Secondary | ICD-10-CM | POA: Insufficient documentation

## 2020-02-13 HISTORY — DX: Diverticulosis of intestine, part unspecified, without perforation or abscess without bleeding: K57.90

## 2020-03-18 ENCOUNTER — Emergency Department (HOSPITAL_COMMUNITY): Payer: Medicare Other

## 2020-03-18 ENCOUNTER — Observation Stay (HOSPITAL_COMMUNITY)
Admission: EM | Admit: 2020-03-18 | Discharge: 2020-03-19 | Disposition: A | Discharge status: Home / Self Care | Payer: Medicare Other | Attending: Family Medicine | Admitting: Family Medicine

## 2020-03-18 DIAGNOSIS — S0101XA Laceration without foreign body of scalp, initial encounter: Secondary | ICD-10-CM

## 2020-03-18 DIAGNOSIS — T148XXA Other injury of unspecified body region, initial encounter: Secondary | ICD-10-CM

## 2020-03-18 DIAGNOSIS — I609 Nontraumatic subarachnoid hemorrhage, unspecified: Principal | ICD-10-CM

## 2020-03-18 DIAGNOSIS — S065X9A Traumatic subdural hemorrhage with loss of consciousness of unspecified duration, initial encounter: Secondary | ICD-10-CM | POA: Diagnosis present

## 2020-03-18 DIAGNOSIS — R1903 Right lower quadrant abdominal swelling, mass and lump: Secondary | ICD-10-CM | POA: Insufficient documentation

## 2020-03-18 DIAGNOSIS — Y92512 Supermarket, store or market as the place of occurrence of the external cause: Secondary | ICD-10-CM | POA: Insufficient documentation

## 2020-03-18 DIAGNOSIS — W1839XA Other fall on same level, initial encounter: Secondary | ICD-10-CM | POA: Diagnosis not present

## 2020-03-18 DIAGNOSIS — D509 Iron deficiency anemia, unspecified: Secondary | ICD-10-CM | POA: Diagnosis not present

## 2020-03-18 DIAGNOSIS — S7001XA Contusion of right hip, initial encounter: Secondary | ICD-10-CM | POA: Diagnosis not present

## 2020-03-18 DIAGNOSIS — N1832 Chronic kidney disease, stage 3b: Secondary | ICD-10-CM | POA: Diagnosis not present

## 2020-03-18 DIAGNOSIS — Z79899 Other long term (current) drug therapy: Secondary | ICD-10-CM | POA: Insufficient documentation

## 2020-03-18 DIAGNOSIS — S066X0A Traumatic subarachnoid hemorrhage without loss of consciousness, initial encounter: Principal | ICD-10-CM | POA: Insufficient documentation

## 2020-03-18 DIAGNOSIS — M109 Gout, unspecified: Secondary | ICD-10-CM | POA: Diagnosis not present

## 2020-03-18 DIAGNOSIS — Z23 Encounter for immunization: Secondary | ICD-10-CM | POA: Diagnosis not present

## 2020-03-18 DIAGNOSIS — S0990XA Unspecified injury of head, initial encounter: Secondary | ICD-10-CM | POA: Diagnosis present

## 2020-03-18 DIAGNOSIS — I509 Heart failure, unspecified: Secondary | ICD-10-CM | POA: Insufficient documentation

## 2020-03-18 DIAGNOSIS — S065XAA Traumatic subdural hemorrhage with loss of consciousness status unknown, initial encounter: Secondary | ICD-10-CM | POA: Diagnosis present

## 2020-03-18 DIAGNOSIS — I130 Hypertensive heart and chronic kidney disease with heart failure and stage 1 through stage 4 chronic kidney disease, or unspecified chronic kidney disease: Secondary | ICD-10-CM | POA: Diagnosis not present

## 2020-03-18 DIAGNOSIS — R413 Other amnesia: Secondary | ICD-10-CM | POA: Insufficient documentation

## 2020-03-18 HISTORY — DX: Other amnesia: R41.3

## 2020-03-18 HISTORY — DX: Contusion of right hip, initial encounter: S70.01XA

## 2020-03-18 HISTORY — DX: Traumatic subdural hemorrhage with loss of consciousness status unknown, initial encounter: S06.5XAA

## 2020-03-18 LAB — COMPREHENSIVE METABOLIC PANEL
ALT: 22 U/L (ref 0–44)
AST: 26 U/L (ref 15–41)
Albumin: 3.7 g/dL (ref 3.5–5.0)
Alkaline Phosphatase: 50 U/L (ref 38–126)
Anion gap: 12 (ref 5–15)
BUN: 51 mg/dL — ABNORMAL HIGH (ref 8–23)
CO2: 22 mmol/L (ref 22–32)
Calcium: 9.7 mg/dL (ref 8.9–10.3)
Chloride: 104 mmol/L (ref 98–111)
Creatinine, Ser: 1.58 mg/dL — ABNORMAL HIGH (ref 0.44–1.00)
GFR, Estimated: 32 mL/min — ABNORMAL LOW (ref >60)
Glucose, Bld: 122 mg/dL — ABNORMAL HIGH (ref 70–99)
Potassium: 4.6 mmol/L (ref 3.5–5.1)
Sodium: 138 mmol/L (ref 135–145)
Total Bilirubin: 0.6 mg/dL (ref 0.3–1.2)
Total Protein: 7.1 g/dL (ref 6.5–8.1)

## 2020-03-18 LAB — CBC WITH DIFFERENTIAL/PLATELET
Abs Immature Granulocytes: 0.04 10*3/uL (ref 0.00–0.07)
Basophils Absolute: 0 10*3/uL (ref 0.0–0.1)
Basophils Relative: 0 %
Eosinophils Absolute: 0.1 10*3/uL (ref 0.0–0.5)
Eosinophils Relative: 1 %
HCT: 38.8 % (ref 36.0–46.0)
Hemoglobin: 11.8 g/dL — ABNORMAL LOW (ref 12.0–15.0)
Immature Granulocytes: 0 %
Lymphocytes Relative: 21 %
Lymphs Abs: 1.9 10*3/uL (ref 0.7–4.0)
MCH: 28 pg (ref 26.0–34.0)
MCHC: 30.4 g/dL (ref 30.0–36.0)
MCV: 91.9 fL (ref 80.0–100.0)
Monocytes Absolute: 0.5 10*3/uL (ref 0.1–1.0)
Monocytes Relative: 6 %
Neutro Abs: 6.7 10*3/uL (ref 1.7–7.7)
Neutrophils Relative %: 72 %
Platelets: 268 10*3/uL (ref 150–400)
RBC: 4.22 MIL/uL (ref 3.87–5.11)
RDW: 15.9 % — ABNORMAL HIGH (ref 11.5–15.5)
WBC: 9.4 10*3/uL (ref 4.0–10.5)
nRBC: 0 % (ref 0.0–0.2)

## 2020-03-18 LAB — I-STAT CHEM 8, ED
BUN: 58 mg/dL — ABNORMAL HIGH (ref 8–23)
Calcium, Ion: 1.11 mmol/L — ABNORMAL LOW (ref 1.15–1.40)
Chloride: 104 mmol/L (ref 98–111)
Creatinine, Ser: 1.6 mg/dL — ABNORMAL HIGH (ref 0.44–1.00)
Glucose, Bld: 121 mg/dL — ABNORMAL HIGH (ref 70–99)
HCT: 36 % (ref 36.0–46.0)
Hemoglobin: 12.2 g/dL (ref 12.0–15.0)
Potassium: 4.5 mmol/L (ref 3.5–5.1)
Sodium: 139 mmol/L (ref 135–145)
TCO2: 25 mmol/L (ref 22–32)

## 2020-03-18 LAB — CBG MONITORING, ED: Glucose-Capillary: 114 mg/dL — ABNORMAL HIGH (ref 70–99)

## 2020-03-18 LAB — TROPONIN I (HIGH SENSITIVITY)
Troponin I (High Sensitivity): 6 ng/L (ref <18)
Troponin I (High Sensitivity): 10 ng/L (ref <18)

## 2020-03-18 MED ORDER — LIDOCAINE HCL (PF) 1 % IJ SOLN
INTRAMUSCULAR | Status: AC
  Filled 2020-03-18: qty 30

## 2020-03-18 MED ORDER — LEVETIRACETAM IN NACL 1000 MG/100ML IV SOLN
1000.0000 mg | Freq: Once | INTRAVENOUS | Status: AC
  Administered 2020-03-18: 1000 mg via INTRAVENOUS
  Filled 2020-03-18: qty 100

## 2020-03-18 MED ORDER — LIDOCAINE-EPINEPHRINE 1 %-1:100000 IJ SOLN
10.0000 mL | Freq: Once | INTRAMUSCULAR | Status: AC
  Administered 2020-03-18: 10 mL via INTRADERMAL

## 2020-03-18 MED ORDER — TETANUS-DIPHTH-ACELL PERTUSSIS 5-2.5-18.5 LF-MCG/0.5 IM SUSY
0.5000 mL | PREFILLED_SYRINGE | Freq: Once | INTRAMUSCULAR | Status: AC
  Administered 2020-03-18: 0.5 mL via INTRAMUSCULAR
  Filled 2020-03-18: qty 0.5

## 2020-03-18 MED ORDER — ONDANSETRON HCL 4 MG/2ML IJ SOLN
4.0000 mg | Freq: Once | INTRAMUSCULAR | Status: AC
  Administered 2020-03-18: 4 mg via INTRAVENOUS
  Filled 2020-03-18: qty 2

## 2020-03-18 MED ORDER — LEVETIRACETAM IN NACL 500 MG/100ML IV SOLN
500.0000 mg | Freq: Two times a day (BID) | INTRAVENOUS | Status: DC
  Administered 2020-03-19: 500 mg via INTRAVENOUS
  Filled 2020-03-18 (×2): qty 100

## 2020-03-18 MED ORDER — FENTANYL CITRATE (PF) 100 MCG/2ML IJ SOLN
50.0000 ug | Freq: Once | INTRAMUSCULAR | Status: AC
  Administered 2020-03-18: 50 ug via INTRAVENOUS
  Filled 2020-03-18: qty 2

## 2020-03-18 MED ORDER — LABETALOL HCL 5 MG/ML IV SOLN
10.0000 mg | Freq: Once | INTRAVENOUS | Status: AC
  Administered 2020-03-18: 10 mg via INTRAVENOUS
  Filled 2020-03-18: qty 4

## 2020-03-18 NOTE — Progress Notes (Signed)
Orthopedic Tech Progress Note Patient Details:  [name] 1935-12-09 [id number]  Ortho Devices Type of Ortho Device: Abdominal binder Ortho Device/Splint Interventions: Application   Post Interventions Patient Tolerated: Well Instructions Provided: Adjustment of device,Care of device,Poper ambulation with device   Tammy Sours 03/18/2020, 11:31 PM

## 2020-03-18 NOTE — ED Notes (Signed)
Called CT to them know pt is ready for scan.

## 2020-03-18 NOTE — ED Triage Notes (Signed)
Pt BIB [facility] after having a fall while grocery shopping at [employer]. Pt noted to be looking around in the aisle, paused and then fell and hit head. Unsure if pt on a blood thinner or if there was any LOC. Pt does have a 1 inch lac to the R side of the forehead. Denying any other pain. Pt was initially altered following the fall and repetitive asking the same questions. Upon arriving pt able to recall events. NAD at this time.

## 2020-03-18 NOTE — H&P (Addendum)
[facility] Admission History and Physical Service Pager: [phone]  Patient name: [name] record number: [id number] Date of birth: 1935/03/24 Age: 85 y.o. Gender: female  Primary Care Provider: Verdell Carmine., MD Consultants:  Neurosurgery Code Status: Full  Preferred Emergency Contact: (Daughter) Colletta [place]: [phone] Pandora Leiter) Leann Reynen: [phone]  Chief Complaint: Syncope  Assessment and Plan: [name] is a 85 y.o. female presenting with syncopal event with fall and subsequent head injury . PMH is significant for CABG, cholecystectomy, T2DM, CKD, CHF, gout  Syncope/LOC Patient was admitted due to a syncopal event with LOC while at a grocery store. No known etiology of syncope identified and no known trigger. Patient unable to remember anything about the fall, unclear if LOC occurred prior to, or after, the fall. Video surveillance showed that the patient turned her head side to side and then was down on the ground. Patient initially altered during the fall with return to baseline upon arrival to the ED. In the ED, patient had a CT scan which showed multiple small traumatic SAH without mass effect and neurosurgery was consulted. Patient also sustained a laceration to the right side of the forehead, which was sutured in the ED.  Neuro exam showed no new focal deficits, patient's left pupil is slightly smaller than the right.  While being evaluated in the ED, patient reportedly had focal shaking of left thigh with question of partial focal seizure that resolved after 20 seconds.  CT chest showed 6 mm posterior right hip subcutaneous hematoma.  CXR of thoracic and lumbar spine with no acute abnormality, CT cervical spine without acute abnormality DDx: Cardiogenic (arrhythmia, structural heart abnormality - s/p CABG and valve replacement) orthostatic HoTN, cerebrovascular (seizure, TIA), neurogenic (carotid sinus syndrome, vasovagal, situational - pt  reportedly turning head down aisle prior to episode), metabolic disturbance (unlikely as pt labs grossly normal), hyper/hypothyroidism - Admit FPTS, attending Dr. Andria Frames - F/u labs: TSH, BNP, BMP, CBC, UA - F/u echocardiogram and carotid Doppler - Up with assistance - Vitals per routine - Continuous cardiac telemetry - PT/OT eval and treat  Small traumatic SAH/small temporal contusion, without mass-effect Patient with LOC and fall with sustained head injury. Found to have multiple small traumatic subarachnoid hemorrhages without mass effect on CT imaging. In the ED, patient reportedly had focal shaking of left thigh with question of partial focal seizure that resolved after 20 seconds. Hgb on admission 11.8>12.2.  -Neurosurgery consulted, appreciate recommendations -Hold aspirin for the next 2 weeks -Keppra 1000 mg loading dose followed by 500 mg twice daily x7 days -Monitor for seizure-like episodes -Every 2 hours neurochecks -Repeat CT brain in a.m. -Systolic blood pressure control with goal <170  Concern for AKI on CKD  Unsure of patient's baseline creatinine as there is nothing in her records and patient unsure with what stage of CKD she has been diagnosed. Creatinine on admission was 1.58. - Continue to monitor with daily BMP  CHF Unknown time of last echocardiogram or EF. Home medications include spironolactone '25mg'$  BID, torsemide '20mg'$  BID, metoprolol succinate '50mg'$  daily. - daily weights - stric Is & Os - Obtain echocardiogram - F/u BNP  DM, diet controlled Patient follows a very strict ketogenic diet with a goal of 18 carbs per day.  She is currently not taking any diabetic medications.  Unknown last A1c. - Obtain A1c - CBGs before every meal and nightly - sSSI  HTN BP on admission 187/71, which down-trended and currently 136/56. Recommendation per neurosurgery to  maintain systolics [id number]. Home medications include losartan '25mg'$  daily, metoprolol succinate '50mg'$  daily. -  Continue home losartan and metoprolol - Goal systolic BP [id number].  Gout Home medications include allopurinol - Continue home allopurinoll  Incidental findings During the work-up performed in the emergency room, the following incidental findings were discovered the may require outpatient follow-up: -A 7 mm right upper pulmonary nodule. -3.6 cm hypodensity of the right thyroid gland.  FEN/GI: Regular Prophylaxis: SCDs  Disposition: Inpatient progressive  History of Present Illness:  [name] is a 85 y.o. female presenting with syncope with LOC and head injury. PMH is significant for CABG, cholecystectomy, T2DM, CKD, CHF, gout  [name] has felt well for the past several days.  Earlier today she stopped at the grocery store and ended up falling during her shopping.  She has had a lot of trouble remembering what happened during her time in the grocery store and to experience of the fall.  She does not appear to have tripped, she simply fell to the floor in the grocery store.  Per EMS report, the security footage from the grocery store noted that she looked around her briefly before falling to the ground.  She has not previously had any trouble with falls.     She does occasionally have pain in her feet from gout but otherwise does not have trouble with ambulation.  Daughter lives next door but pt does not need any help living at home. She is entirely independent with her living situation.  In the ED, CT head showed evidence of subarachnoid hemorrhage.  Neurosurgery was consulted and recommended holding aspirin for 2 weeks and reimaging him with a head CT in the morning.  She was admitted to the family medicine service for further monitoring and care.   Review Of Systems: Per HPI with the following additions:   Review of Systems  Constitutional: Negative for activity change, appetite change, fatigue and fever.  HENT: Negative for congestion, rhinorrhea, sinus pressure and sinus pain.    Eyes: Negative for visual disturbance.  Respiratory: Negative for cough, chest tightness and shortness of breath.   Cardiovascular: Positive for leg swelling. Negative for chest pain.  Gastrointestinal: Negative for abdominal pain, constipation and diarrhea.  Genitourinary: Negative for frequency.  Neurological: Positive for syncope, weakness and headaches. Negative for tremors and light-headedness.     Patient Active Problem List   Diagnosis Date Noted  . Subarachnoid hemorrhage ([place]) 03/19/2020  . Subcutaneous hematoma   . Type 2 diabetes mellitus without complication, without long-term current use of insulin ([place])   . Subdural hematoma ([place]) 03/18/2020    Past Medical History: No past medical history on file.  Past Surgical History: Cholecystectomy  Social History:   Tobacco - minimal history 40 years ago Alcohol - occasional glass of wine every 2-3 weeks Drugs - medicinal CBD paste for sleep, denies other drug use  Additional social history:   Please also refer to relevant sections of EMR.  Family History: Cardiac disease - father Diabetes - mother and maternal grandmother  Allergies and Medications: Allergies  Allergen Reactions  . Januvia [Sitagliptin] Rash  . Keflex [Cephalexin] Rash   No current facility-administered medications on file prior to encounter.   Current Outpatient Medications on File Prior to Encounter  Medication Sig Dispense Refill  . allopurinol (ZYLOPRIM) 100 MG tablet Take 200 mg by mouth daily.    . cholecalciferol (VITAMIN D3) 25 MCG (1000 UNIT) tablet Take 1,000 Units by mouth daily.    [name]  Ferrous Sulfate (IRON PO) Take 1 tablet by mouth daily.    [name] losartan (COZAAR) 25 MG tablet Take 25 mg by mouth daily.    . magnesium 30 MG tablet Take 30 mg by mouth daily.    . metoprolol succinate (TOPROL-XL) 50 MG 24 hr tablet Take 50 mg by mouth daily.    . Multiple Vitamins-Minerals (MULTIVITAMIN ADULTS) TABS Take 1 tablet by mouth daily.    .  Omega-3 Fatty Acids (FISH OIL) 1000 MG CAPS Take 1 capsule by mouth daily.    . potassium chloride (KLOR-CON) 10 MEQ tablet Take 10 mEq by mouth every evening.    [name] spironolactone (ALDACTONE) 25 MG tablet Take 25 mg by mouth 2 (two) times daily.    [name] torsemide (DEMADEX) 20 MG tablet Take 20 mg by mouth 2 (two) times daily.    . vitamin B-12 (CYANOCOBALAMIN) 1000 MCG tablet Take 1,000 mcg by mouth daily.    . pravastatin (PRAVACHOL) 80 MG tablet Take 80 mg by mouth daily.      Objective: BP (!) 136/56 (BP Location: Right Arm)   Pulse 63   Temp 98.4 F (36.9 C) (Oral)   Resp 14   Ht 5' 0.5" (1.537 m)   Wt 63.5 kg   SpO2 97%   BMI 26.89 kg/m  Exam: General -- oriented x3, pleasant and cooperative. HEENT -- Head is normocephalic. Pupil reactive on the right side, difficulty appreciating left reactivity during exam. EOMI. Ears, nose and throat were benign. Neck -- supple; no bruits. Integument -- intact. Right sided head lac with suture repair, dried blood in hair. Chest -- CTAB, no increased WOB, comfortable on room air Cardiac -- RRR. No murmurs noted.  Abdomen --abdominal binder in place Genital, rectal and breast exam -- deferred. CNS -- cranial nerves II through XII grossly intact Extremeties - mild tenderness on palpation of BLE (pt reports somewhat chronic) ROM good. 5/5 bilateral strength. Dorsalis pedis pulses present and symmetrical.    Labs and Imaging: CBC BMET  Recent Labs  Lab 03/18/20 1759 03/18/20 1819  WBC 9.4  --   HGB 11.8* 12.2  HCT 38.8 36.0  PLT 268  --    Recent Labs  Lab 03/18/20 1759 03/18/20 1819  NA 138 139  K 4.6 4.5  CL 104 104  CO2 22  --   BUN 51* 58*  CREATININE 1.58* 1.60*  GLUCOSE 122* 121*  CALCIUM 9.7  --      EKG: sinus rhythm, HR 60, prolonged PR, possible RBBB  CT ABDOMEN PELVIS WO CONTRAST  Result Date: 03/18/2020 CLINICAL DATA:  Status post fall, trauma. Palpated large protruding nodule to R lower abd, pt facial grimacing  and co of tenderness and pain EXAM: CT CHEST, ABDOMEN AND PELVIS WITHOUT CONTRAST TECHNIQUE: Multidetector CT imaging of the chest, abdomen and pelvis was performed following the standard protocol without IV contrast. COMPARISON:  None. FINDINGS: CHEST: Ports and Devices: None. Lungs/airways: No focal consolidation. A 7 mm right upper lobe pulmonary nodule (5:61). Bilateral lower lobe subsegmental atelectasis. A 4 mm left lower lobe pulmonary nodule (5:67). No pulmonary mass. No pulmonary contusion or laceration. No pneumatocele formation. The central airways are patent. Pleura: No pleural effusion. No pneumothorax. No hemothorax. Lymph Nodes: No mediastinal, hilar, or axillary lymphadenopathy. Mediastinum: No pneumomediastinum. No largely anterior mediastinal abnormal density. The thoracic aorta is normal in caliber. Aortic valve replacement. Severe mitral annular calcifications. The heart is normal in size. No significant pericardial effusion. The esophagus is  unremarkable. 3.6 cm slightly hypodense lesion within the right thyroid gland. Small hiatal hernia. Chest Wall / Breasts: No chest wall mass. Musculoskeletal: Sternotomy wires are noted. No acute displaced rib or sternal fracture. No spinal fracture. Multilevel osteophyte formation. ABDOMEN / PELVIS: Liver: Not enlarged. No focal lesion. No gross injury. Biliary System: Status post cholecystectomy. No biliary ductal dilatation. Pancreas: Normal pancreatic contour. No main pancreatic duct dilatation. Spleen: Not enlarged. No focal lesion. No gross injury. Adrenal Glands: No nodularity bilaterally. Kidneys: Nonspecific bilateral perinephric stranding. No nephrolithiasis bilaterally. No hydronephrosis. No gross injury. No hydroureter. The urinary bladder is unremarkable. Bowel: No small bowel wall thickening or dilatation. Suggestion of a large third portion of the duodenum/proximal jejunum diverticula (3:59). Diffuse diverticulosis with associated bowel  thickening and pericolonic fat stranding of the distal descending colon proximal sigmoid colon. The appendix is unremarkable. Mesentery, Omentum, and Peritoneum: No free-fluid. No pneumoperitoneum. No organized fluid collection. Pelvic Organs: The uterus and bilateral ovaries/adnexal regions are unremarkable. Lymph Nodes: No abdominal, pelvic, inguinal lymphadenopathy. Vasculature: No abdominal aorta or iliac aneurysm. Musculoskeletal: There is a 6 cm round density along the posterior right hip at the level of the iliac crest likely representing a hematoma. Associated surrounding fat stranding. No acute pelvic fracture. No spinal fracture. Multilevel osteophyte formation facet arthropathy. Dense sclerotic lesion within the L5 vertebral body is indeterminate the likely represents a bone. IMPRESSION: 1. A 6 cm posterior right hip subcutaneus soft tissue hematoma. 2. Distal descending and proximal sigmoid colon acute diverticulitis. Much less likely differential diagnosis includes trauma to the large bowel. Please note limited evaluation for intramural abscess formation on this noncontrast study. 3. Otherwise no other acute traumatic injury to the chest, abdomen, or pelvis with limited evaluation on this noncontrast study. 4. No acute fracture or traumatic malalignment of the thoracic or lumbar spine. Other imaging findings of potential clinical significance: 1. Suggestion of a large third portion of the duodenum/proximal jejunum diverticula. 2. A 7 mm right upper pulmonary nodule. Comparison with prior cross-sectional imaging would be of value to evaluate stability. Non-contrast chest CT at 6-12 months is recommended. If the nodule is stable at time of repeat CT, then future CT at 18-24 months (from today's scan) is considered optional for low-risk patients, but is recommended for high-risk patients. This recommendation follows the consensus statement: Guidelines for Management of Incidental Pulmonary Nodules Detected  on CT Images: From the Fleischner Society 2017; Radiology 2017; 284:228-243. 3. A 3.6 cm slightly hypodense lesion within the right thyroid gland. Comparison with prior ultrasound or cross-sectional imaging would be of value to evaluate for stability. Recommend thyroid US (ref: J Am Coll Radiol. 2015 Feb;12(2): 143-50). However, please note that in the setting of significant comorbidities or limited life expectancy, no follow-up recommended (ref: J Am Coll Radiol. 2015 Feb;12(2): 143-50). 4. Aortic Atherosclerosis (ICD10-I70.0) including severe mitral annular calcifications. These results were called by telephone at the time of interpretation on 03/18/2020 at 8:58 pm to provider Dr. Ayesha Rumpf, who verbally acknowledged these results. Electronically Signed   By: Iven Finn M.D.   On: 03/18/2020 21:18   DG Thoracic Spine 2 View  Result Date: 03/18/2020 CLINICAL DATA:  Back pain. EXAM: THORACIC SPINE 2 VIEWS COMPARISON:  None. FINDINGS: There is no evidence of thoracic spine fracture. Alignment is normal. No other significant bone abnormalities are identified. Prior CABG and AVR. Dense mitral annular calcification. IMPRESSION: 1. Negative. Electronically Signed   By: Titus Dubin M.D.   On: 03/18/2020 19:55  DG Lumbar Spine Complete  Result Date: 03/18/2020 CLINICAL DATA:  Back pain. EXAM: LUMBAR SPINE - COMPLETE 4+ VIEW COMPARISON:  None. FINDINGS: There is no evidence of lumbar spine fracture. Alignment is normal. Intervertebral disc spaces are maintained. L5-S1 facet arthropathy. IMPRESSION: 1. No acute osseous abnormality. 2. L5-S1 facet arthropathy. Electronically Signed   By: Titus Dubin M.D.   On: 03/18/2020 19:57   CT Head Wo Contrast  Result Date: 03/18/2020 CLINICAL DATA:  Fall on blood thinners EXAM: CT HEAD WITHOUT CONTRAST CT CERVICAL SPINE WITHOUT CONTRAST TECHNIQUE: Multidetector CT imaging of the head and cervical spine was performed following the standard protocol without intravenous  contrast. Multiplanar CT image reconstructions of the cervical spine were also generated. COMPARISON:  None. FINDINGS: CT HEAD FINDINGS Brain: No evidence of large-territorial acute infarction. No parenchymal hemorrhage. No mass lesion. Small to moderate volume subarachnoid hemorrhage of the right frontotemporal, left temporal, left frontal, bilateral occipital (left greater than right lobes). Also trace subarachnoid hemorrhage along the left cerebellum. No definite evidence of a subdural or epidural hematoma. No mass effect or midline shift. No hydrocephalus. Basilar cisterns are patent. Vascular: No hyperdense vessel. Atherosclerotic calcifications are present within the cavernous internal carotid arteries. Skull: No acute fracture or focal lesion. Sinuses/Orbits: Mucosal thickening of the left maxillary sinus. Otherwise the remaining visualized paranasal sinuses and mastoid air cells are clear. Bilateral lens replacement. Otherwise the orbits are unremarkable. Other: Right frontoparietal scalp soft tissue edema and trace subgaleal hematoma. CT CERVICAL SPINE FINDINGS Alignment: Normal. Skull base and vertebrae: No acute fracture. No aggressive appearing focal osseous lesion or focal pathologic process. Soft tissues and spinal canal: No prevertebral fluid or swelling. No visible canal hematoma. Upper chest: Unremarkable. Other: Approximately 3.7 cm hypodense right thyroid gland nodule. IMPRESSION: 1. Moderate volume scattered cerebral subarachnoid hemorrhage as well as trace subarachnoid left cerebellar hemorrhage. 2. No acute displaced fracture or traumatic listhesis of the cervical spine. 3. Approximately 3.7 cm hypodense right thyroid gland nodule. Recommend thyroid US (ref: J Am Coll Radiol. 2015 Feb;12(2): 143-50). However, please note that in the setting of significant comorbidities or limited life expectancy, no follow-up recommended (ref: J Am Coll Radiol. 2015 Feb;12(2): 143-50). These results were called  by telephone at the time of interpretation on 03/18/2020 at 6:53 pm to provider PA [facility] [place] , who verbally acknowledged these results. Electronically Signed   By: Iven Finn M.D.   On: 03/18/2020 18:57   CT Chest Wo Contrast  Result Date: 03/18/2020 CLINICAL DATA:  Status post fall, trauma. Palpated large protruding nodule to R lower abd, pt facial grimacing and co of tenderness and pain EXAM: CT CHEST, ABDOMEN AND PELVIS WITHOUT CONTRAST TECHNIQUE: Multidetector CT imaging of the chest, abdomen and pelvis was performed following the standard protocol without IV contrast. COMPARISON:  None. FINDINGS: CHEST: Ports and Devices: None. Lungs/airways: No focal consolidation. A 7 mm right upper lobe pulmonary nodule (5:61). Bilateral lower lobe subsegmental atelectasis. A 4 mm left lower lobe pulmonary nodule (5:67). No pulmonary mass. No pulmonary contusion or laceration. No pneumatocele formation. The central airways are patent. Pleura: No pleural effusion. No pneumothorax. No hemothorax. Lymph Nodes: No mediastinal, hilar, or axillary lymphadenopathy. Mediastinum: No pneumomediastinum. No largely anterior mediastinal abnormal density. The thoracic aorta is normal in caliber. Aortic valve replacement. Severe mitral annular calcifications. The heart is normal in size. No significant pericardial effusion. The esophagus is unremarkable. 3.6 cm slightly hypodense lesion within the right thyroid gland. Small hiatal hernia. Chest Wall /  Breasts: No chest wall mass. Musculoskeletal: Sternotomy wires are noted. No acute displaced rib or sternal fracture. No spinal fracture. Multilevel osteophyte formation. ABDOMEN / PELVIS: Liver: Not enlarged. No focal lesion. No gross injury. Biliary System: Status post cholecystectomy. No biliary ductal dilatation. Pancreas: Normal pancreatic contour. No main pancreatic duct dilatation. Spleen: Not enlarged. No focal lesion. No gross injury. Adrenal Glands: No nodularity  bilaterally. Kidneys: Nonspecific bilateral perinephric stranding. No nephrolithiasis bilaterally. No hydronephrosis. No gross injury. No hydroureter. The urinary bladder is unremarkable. Bowel: No small bowel wall thickening or dilatation. Suggestion of a large third portion of the duodenum/proximal jejunum diverticula (3:59). Diffuse diverticulosis with associated bowel thickening and pericolonic fat stranding of the distal descending colon proximal sigmoid colon. The appendix is unremarkable. Mesentery, Omentum, and Peritoneum: No free-fluid. No pneumoperitoneum. No organized fluid collection. Pelvic Organs: The uterus and bilateral ovaries/adnexal regions are unremarkable. Lymph Nodes: No abdominal, pelvic, inguinal lymphadenopathy. Vasculature: No abdominal aorta or iliac aneurysm. Musculoskeletal: There is a 6 cm round density along the posterior right hip at the level of the iliac crest likely representing a hematoma. Associated surrounding fat stranding. No acute pelvic fracture. No spinal fracture. Multilevel osteophyte formation facet arthropathy. Dense sclerotic lesion within the L5 vertebral body is indeterminate the likely represents a bone. IMPRESSION: 1. A 6 cm posterior right hip subcutaneus soft tissue hematoma. 2. Distal descending and proximal sigmoid colon acute diverticulitis. Much less likely differential diagnosis includes trauma to the large bowel. Please note limited evaluation for intramural abscess formation on this noncontrast study. 3. Otherwise no other acute traumatic injury to the chest, abdomen, or pelvis with limited evaluation on this noncontrast study. 4. No acute fracture or traumatic malalignment of the thoracic or lumbar spine. Other imaging findings of potential clinical significance: 1. Suggestion of a large third portion of the duodenum/proximal jejunum diverticula. 2. A 7 mm right upper pulmonary nodule. Comparison with prior cross-sectional imaging would be of value to  evaluate stability. Non-contrast chest CT at 6-12 months is recommended. If the nodule is stable at time of repeat CT, then future CT at 18-24 months (from today's scan) is considered optional for low-risk patients, but is recommended for high-risk patients. This recommendation follows the consensus statement: Guidelines for Management of Incidental Pulmonary Nodules Detected on CT Images: From the Fleischner Society 2017; Radiology 2017; 284:228-243. 3. A 3.6 cm slightly hypodense lesion within the right thyroid gland. Comparison with prior ultrasound or cross-sectional imaging would be of value to evaluate for stability. Recommend thyroid US (ref: J Am Coll Radiol. 2015 Feb;12(2): 143-50). However, please note that in the setting of significant comorbidities or limited life expectancy, no follow-up recommended (ref: J Am Coll Radiol. 2015 Feb;12(2): 143-50). 4. Aortic Atherosclerosis (ICD10-I70.0) including severe mitral annular calcifications. These results were called by telephone at the time of interpretation on 03/18/2020 at 8:58 pm to provider Dr. Ayesha Rumpf, who verbally acknowledged these results. Electronically Signed   By: Iven Finn M.D.   On: 03/18/2020 21:18   CT Cervical Spine Wo Contrast  Result Date: 03/18/2020 CLINICAL DATA:  Fall on blood thinners EXAM: CT HEAD WITHOUT CONTRAST CT CERVICAL SPINE WITHOUT CONTRAST TECHNIQUE: Multidetector CT imaging of the head and cervical spine was performed following the standard protocol without intravenous contrast. Multiplanar CT image reconstructions of the cervical spine were also generated. COMPARISON:  None. FINDINGS: CT HEAD FINDINGS Brain: No evidence of large-territorial acute infarction. No parenchymal hemorrhage. No mass lesion. Small to moderate volume subarachnoid hemorrhage of the right  frontotemporal, left temporal, left frontal, bilateral occipital (left greater than right lobes). Also trace subarachnoid hemorrhage along the left cerebellum. No  definite evidence of a subdural or epidural hematoma. No mass effect or midline shift. No hydrocephalus. Basilar cisterns are patent. Vascular: No hyperdense vessel. Atherosclerotic calcifications are present within the cavernous internal carotid arteries. Skull: No acute fracture or focal lesion. Sinuses/Orbits: Mucosal thickening of the left maxillary sinus. Otherwise the remaining visualized paranasal sinuses and mastoid air cells are clear. Bilateral lens replacement. Otherwise the orbits are unremarkable. Other: Right frontoparietal scalp soft tissue edema and trace subgaleal hematoma. CT CERVICAL SPINE FINDINGS Alignment: Normal. Skull base and vertebrae: No acute fracture. No aggressive appearing focal osseous lesion or focal pathologic process. Soft tissues and spinal canal: No prevertebral fluid or swelling. No visible canal hematoma. Upper chest: Unremarkable. Other: Approximately 3.7 cm hypodense right thyroid gland nodule. IMPRESSION: 1. Moderate volume scattered cerebral subarachnoid hemorrhage as well as trace subarachnoid left cerebellar hemorrhage. 2. No acute displaced fracture or traumatic listhesis of the cervical spine. 3. Approximately 3.7 cm hypodense right thyroid gland nodule. Recommend thyroid US (ref: J Am Coll Radiol. 2015 Feb;12(2): 143-50). However, please note that in the setting of significant comorbidities or limited life expectancy, no follow-up recommended (ref: J Am Coll Radiol. 2015 Feb;12(2): 143-50). These results were called by telephone at the time of interpretation on 03/18/2020 at 6:53 pm to provider PA [facility] [place] , who verbally acknowledged these results. Electronically Signed   By: Iven Finn M.D.   On: 03/18/2020 18:57   DG Pelvis Portable  Result Date: 03/18/2020 CLINICAL DATA:  Trauma, fall on blood thinners. EXAM: PORTABLE PELVIS 1-2 VIEWS COMPARISON:  None. FINDINGS: The bones are diffusely under mineralized. The cortical margins of the bony pelvis are  intact. No fracture. Pubic symphysis and sacroiliac joints are congruent. Both femoral heads are well-seated in the respective acetabula. IMPRESSION: No pelvic fracture. Electronically Signed   By: Keith Rake M.D.   On: 03/18/2020 18:34   DG Chest Portable 1 View  Result Date: 03/18/2020 CLINICAL DATA:  Trauma, fall on blood thinners. EXAM: PORTABLE CHEST 1 VIEW COMPARISON:  None. FINDINGS: Rotated exam. Median sternotomy and prosthetic cardiac valve. Borderline cardiomegaly. Aortic atherosclerosis. No pneumothorax or large pleural effusion. Calcified granuloma at the left lung base. No acute osseous abnormalities are seen. IMPRESSION: Rotated exam without evidence of acute traumatic injury. Electronically Signed   By: Keith Rake M.D.   On: 03/18/2020 18:33     Rise Patience, DO 03/19/2020, 1:24 AM PGY-1, [place] Intern pager: [phone], text pages welcome

## 2020-03-18 NOTE — ED Notes (Signed)
During assessment pt co of pain to lower right abd, upon abd assessment this RN palpated large protruding nodule to R lower abd, pt facial grimacing and co of tenderness and pain 10/10 with palpation. This RN made [place], [place] aware for assessment of area.

## 2020-03-18 NOTE — Consult Note (Addendum)
   Providing Compassionate, Quality Care - Together  Neurosurgery Consult  Referring physician: Dr. Ralene Bathe Reason for referral: Traumatic subarachnoid hemorrhage  Chief Complaint: Syncopal fall  History of Present Illness: This is an 85 year old female with a history of diabetes, heart failure, CABG, hypertension that had a syncopal event at the grocery store with an unknown etiology.  She was brought to the emergency department for further work-up.  Her time down was unknown but is returned to her baseline at this point.  She complains of a headache.  She denies neck pain whatsoever.  She denies any numbness, tingling or weakness.  She does complain of some right flank pain.  She denies any focal weakness, denies any bowel or bladder changes.  She does complain of some midthoracic back pain that is slightly tender to palpation.  CT brain revealed multiple small traumatic subarachnoid hemorrhages without mass-effect.  She does take aspirin for her chronic heart disease.  She denies taking any other blood thinners, her daughter is at bedside and helps with her history.  Medications: I have reviewed the patient's current medications. Allergies: No Known Allergies  History reviewed. No pertinent family history. Social History:  has no history on file for tobacco use, alcohol use, and drug use.  ROS: 14 point review of systems was obtained which all pertinent positives and negatives are listed in HPI above.  Physical Exam:  Vital signs in last 24 hours: Temp:  [98 F (36.7 C)-98.3 F (36.8 C)] 98 F (36.7 C) (07/25 1814) Pulse Rate:  [58-128] 65 (07/26 0746) Resp:  [11-18] 14 (07/26 0217) BP: (138-182)/(65-125) 153/88 (07/26 0700) SpO2:  [91 %-98 %] 96 % (07/26 0746) PE: A&O x3 Mild distress secondary pain PERRLA EOMI Cranial nerves II through XII intact Right frontal scalp laceration bandaged at this time Bilateral upper/lower extremity full strength throughout Sensory intact light  touch Cervical collar removed, no pain with active range of motion.  No tenderness to palpation whatsoever.   Impression/Assessment:  85 year old female with  1.  Small traumatic subarachnoid hemorrhage/small temporal contusion, without mass-effect  Plan:  -Hold aspirin for 2 weeks -Keppra 1000 mg now, 500 twice daily x7 days.  There is a questionable episode of thigh twitching, monitor for seizure-like episodes -Repeat CT brain in a.m. -q2-hour neurochecks -SBP control, goal less than 170 -Cervical collar discontinued -Laceration to be repaired by ED provider -Syncopal work-up per primary   Thank you for allowing me to participate in this patient's care.  Please do not hesitate to call with questions or concerns.   Elwin Sleight, [facility] Cell: [phone]

## 2020-03-18 NOTE — Progress Notes (Signed)
Orthopedic Tech Progress Note Patient Details:  [name] 18-Aug-1935 [id number] Delivered abdominal binder to pts room, she was not comfortable with it being applied at the time of my arrival. Pt was also having sutures applied by PA at the time of my arrival. I informed the Nurse Tech to give me a call if I was needed to apply the binder later on or that if the nursing staff felt comfortable they could apply the abdominal binder themselves. Patient ID: [name], female   DOB: 1935-01-25, 85 y.o.   MRN: [id number]   Tammy Sours 03/18/2020, 10:11 PM

## 2020-03-18 NOTE — Consult Note (Signed)
Activation and Reason: level II, fall  Primary Survey: airway intact, breath sounds present bilateral, pulses intact  [name] is an 85 y.o. female.  HPI: 85 yo female was at the grocery store and fell. She does not remember the event. She hurts all over but worse in her head. Pain is constant. It does not radiate. Pain medications help some. She takes a baby aspirin but no other blood thinner.  No past medical history on file.    No family history on file.  Social History:  has no history on file for tobacco use, alcohol use, and drug use.  Allergies:  Allergies  Allergen Reactions  . Januvia [Sitagliptin] Rash  . Keflex [Cephalexin] Rash    Medications: I have reviewed the patient's current medications.  Results for orders placed or performed during the hospital encounter of 03/18/20 (from the past 48 hour(s))  Comprehensive metabolic panel     Status: Abnormal   Collection Time: 03/18/20  5:59 PM  Result Value Ref Range   Sodium 138 135 - 145 mmol/L   Potassium 4.6 3.5 - 5.1 mmol/L   Chloride 104 98 - 111 mmol/L   CO2 22 22 - 32 mmol/L   Glucose, Bld 122 (H) 70 - 99 mg/dL    Comment: Glucose reference range applies only to samples taken after fasting for at least 8 hours.   BUN 51 (H) 8 - 23 mg/dL   Creatinine, Ser 1.58 (H) 0.44 - 1.00 mg/dL   Calcium 9.7 8.9 - 10.3 mg/dL   Total Protein 7.1 6.5 - 8.1 g/dL   Albumin 3.7 3.5 - 5.0 g/dL   AST 26 15 - 41 U/L   ALT 22 0 - 44 U/L   Alkaline Phosphatase 50 38 - 126 U/L   Total Bilirubin 0.6 0.3 - 1.2 mg/dL   GFR, Estimated 32 (L) >60 mL/min    Comment: (NOTE) Calculated using the CKD-EPI Creatinine Equation (2021)    Anion gap 12 5 - 15    Comment: Performed at [facility] [address] [place], [place] [address]  CBC with Differential     Status: Abnormal   Collection Time: 03/18/20  5:59 PM  Result Value Ref Range   WBC 9.4 4.0 - 10.5 K/uL   RBC 4.22 3.87 - 5.11 MIL/uL   Hemoglobin 11.8 (L) 12.0 -  15.0 g/dL   HCT 38.8 36.0 - 46.0 %   MCV 91.9 80.0 - 100.0 fL   MCH 28.0 26.0 - 34.0 pg   MCHC 30.4 30.0 - 36.0 g/dL   RDW 15.9 (H) 11.5 - 15.5 %   Platelets 268 150 - 400 K/uL   nRBC 0.0 0.0 - 0.2 %   Neutrophils Relative % 72 %   Neutro Abs 6.7 1.7 - 7.7 K/uL   Lymphocytes Relative 21 %   Lymphs Abs 1.9 0.7 - 4.0 K/uL   Monocytes Relative 6 %   Monocytes Absolute 0.5 0.1 - 1.0 K/uL   Eosinophils Relative 1 %   Eosinophils Absolute 0.1 0.0 - 0.5 K/uL   Basophils Relative 0 %   Basophils Absolute 0.0 0.0 - 0.1 K/uL   Immature Granulocytes 0 %   Abs Immature Granulocytes 0.04 0.00 - 0.07 K/uL    Comment: Performed at [facility] [address], [place], [place] [address]  Troponin I (High Sensitivity)     Status: None   Collection Time: 03/18/20  5:59 PM  Result Value Ref Range  Troponin I (High Sensitivity) 6 <18 ng/L    Comment: (NOTE) Elevated high sensitivity troponin I (hsTnI) values and significant  changes across serial measurements may suggest ACS but many other  chronic and acute conditions are known to elevate hsTnI results.  Refer to the "Links" section for chest pain algorithms and additional  guidance. Performed at [facility], [place] [address] [place], [place] [address]   POC CBG, ED     Status: Abnormal   Collection Time: 03/18/20  6:04 PM  Result Value Ref Range   Glucose-Capillary 114 (H) 70 - 99 mg/dL    Comment: Glucose reference range applies only to samples taken after fasting for at least 8 hours.  I-stat chem 8, ED (not at [facility] or [facility])     Status: Abnormal   Collection Time: 03/18/20  6:19 PM  Result Value Ref Range   Sodium 139 135 - 145 mmol/L   Potassium 4.5 3.5 - 5.1 mmol/L   Chloride 104 98 - 111 mmol/L   BUN 58 (H) 8 - 23 mg/dL   Creatinine, Ser 1.60 (H) 0.44 - 1.00 mg/dL   Glucose, Bld 121 (H) 70 - 99 mg/dL    Comment: Glucose reference range applies only to samples taken after fasting for at least 8 hours.   Calcium, Ion  1.11 (L) 1.15 - 1.40 mmol/L   TCO2 25 22 - 32 mmol/L   Hemoglobin 12.2 12.0 - 15.0 g/dL   HCT 36.0 36.0 - 46.0 %  Troponin I (High Sensitivity)     Status: None   Collection Time: 03/18/20  8:01 PM  Result Value Ref Range   Troponin I (High Sensitivity) 10 <18 ng/L    Comment: (NOTE) Elevated high sensitivity troponin I (hsTnI) values and significant  changes across serial measurements may suggest ACS but many other  chronic and acute conditions are known to elevate hsTnI results.  Refer to the "Links" section for chest pain algorithms and additional  guidance. Performed at [facility], [place] [address] [place], [place] [address]     CT ABDOMEN PELVIS WO CONTRAST  Result Date: 03/18/2020 CLINICAL DATA:  Status post fall, trauma. Palpated large protruding nodule to R lower abd, pt facial grimacing and co of tenderness and pain EXAM: CT CHEST, ABDOMEN AND PELVIS WITHOUT CONTRAST TECHNIQUE: Multidetector CT imaging of the chest, abdomen and pelvis was performed following the standard protocol without IV contrast. COMPARISON:  None. FINDINGS: CHEST: Ports and Devices: None. Lungs/airways: No focal consolidation. A 7 mm right upper lobe pulmonary nodule (5:61). Bilateral lower lobe subsegmental atelectasis. A 4 mm left lower lobe pulmonary nodule (5:67). No pulmonary mass. No pulmonary contusion or laceration. No pneumatocele formation. The central airways are patent. Pleura: No pleural effusion. No pneumothorax. No hemothorax. Lymph Nodes: No mediastinal, hilar, or axillary lymphadenopathy. Mediastinum: No pneumomediastinum. No largely anterior mediastinal abnormal density. The thoracic aorta is normal in caliber. Aortic valve replacement. Severe mitral annular calcifications. The heart is normal in size. No significant pericardial effusion. The esophagus is unremarkable. 3.6 cm slightly hypodense lesion within the right thyroid gland. Small hiatal hernia. Chest Wall / Breasts: No chest wall  mass. Musculoskeletal: Sternotomy wires are noted. No acute displaced rib or sternal fracture. No spinal fracture. Multilevel osteophyte formation. ABDOMEN / PELVIS: Liver: Not enlarged. No focal lesion. No gross injury. Biliary System: Status post cholecystectomy. No biliary ductal dilatation. Pancreas: Normal pancreatic contour. No main pancreatic duct dilatation. Spleen: Not enlarged. No focal lesion. No  gross injury. Adrenal Glands: No nodularity bilaterally. Kidneys: Nonspecific bilateral perinephric stranding. No nephrolithiasis bilaterally. No hydronephrosis. No gross injury. No hydroureter. The urinary bladder is unremarkable. Bowel: No small bowel wall thickening or dilatation. Suggestion of a large third portion of the duodenum/proximal jejunum diverticula (3:59). Diffuse diverticulosis with associated bowel thickening and pericolonic fat stranding of the distal descending colon proximal sigmoid colon. The appendix is unremarkable. Mesentery, Omentum, and Peritoneum: No free-fluid. No pneumoperitoneum. No organized fluid collection. Pelvic Organs: The uterus and bilateral ovaries/adnexal regions are unremarkable. Lymph Nodes: No abdominal, pelvic, inguinal lymphadenopathy. Vasculature: No abdominal aorta or iliac aneurysm. Musculoskeletal: There is a 6 cm round density along the posterior right hip at the level of the iliac crest likely representing a hematoma. Associated surrounding fat stranding. No acute pelvic fracture. No spinal fracture. Multilevel osteophyte formation facet arthropathy. Dense sclerotic lesion within the L5 vertebral body is indeterminate the likely represents a bone. IMPRESSION: 1. A 6 cm posterior right hip subcutaneus soft tissue hematoma. 2. Distal descending and proximal sigmoid colon acute diverticulitis. Much less likely differential diagnosis includes trauma to the large bowel. Please note limited evaluation for intramural abscess formation on this noncontrast study. 3.  Otherwise no other acute traumatic injury to the chest, abdomen, or pelvis with limited evaluation on this noncontrast study. 4. No acute fracture or traumatic malalignment of the thoracic or lumbar spine. Other imaging findings of potential clinical significance: 1. Suggestion of a large third portion of the duodenum/proximal jejunum diverticula. 2. A 7 mm right upper pulmonary nodule. Comparison with prior cross-sectional imaging would be of value to evaluate stability. Non-contrast chest CT at 6-12 months is recommended. If the nodule is stable at time of repeat CT, then future CT at 18-24 months (from today's scan) is considered optional for low-risk patients, but is recommended for high-risk patients. This recommendation follows the consensus statement: Guidelines for Management of Incidental Pulmonary Nodules Detected on CT Images: From the Fleischner Society 2017; Radiology 2017; 284:228-243. 3. A 3.6 cm slightly hypodense lesion within the right thyroid gland. Comparison with prior ultrasound or cross-sectional imaging would be of value to evaluate for stability. Recommend thyroid US (ref: J Am Coll Radiol. 2015 Feb;12(2): 143-50). However, please note that in the setting of significant comorbidities or limited life expectancy, no follow-up recommended (ref: J Am Coll Radiol. 2015 Feb;12(2): 143-50). 4. Aortic Atherosclerosis (ICD10-I70.0) including severe mitral annular calcifications. These results were called by telephone at the time of interpretation on 03/18/2020 at 8:58 pm to provider Dr. Ayesha Rumpf, who verbally acknowledged these results. Electronically Signed   By: Iven Finn M.D.   On: 03/18/2020 21:18   DG Thoracic Spine 2 View  Result Date: 03/18/2020 CLINICAL DATA:  Back pain. EXAM: THORACIC SPINE 2 VIEWS COMPARISON:  None. FINDINGS: There is no evidence of thoracic spine fracture. Alignment is normal. No other significant bone abnormalities are identified. Prior CABG and AVR. Dense mitral  annular calcification. IMPRESSION: 1. Negative. Electronically Signed   By: Titus Dubin M.D.   On: 03/18/2020 19:55   DG Lumbar Spine Complete  Result Date: 03/18/2020 CLINICAL DATA:  Back pain. EXAM: LUMBAR SPINE - COMPLETE 4+ VIEW COMPARISON:  None. FINDINGS: There is no evidence of lumbar spine fracture. Alignment is normal. Intervertebral disc spaces are maintained. L5-S1 facet arthropathy. IMPRESSION: 1. No acute osseous abnormality. 2. L5-S1 facet arthropathy. Electronically Signed   By: Titus Dubin M.D.   On: 03/18/2020 19:57   CT Head Wo Contrast  Result Date: 03/18/2020  CLINICAL DATA:  Fall on blood thinners EXAM: CT HEAD WITHOUT CONTRAST CT CERVICAL SPINE WITHOUT CONTRAST TECHNIQUE: Multidetector CT imaging of the head and cervical spine was performed following the standard protocol without intravenous contrast. Multiplanar CT image reconstructions of the cervical spine were also generated. COMPARISON:  None. FINDINGS: CT HEAD FINDINGS Brain: No evidence of large-territorial acute infarction. No parenchymal hemorrhage. No mass lesion. Small to moderate volume subarachnoid hemorrhage of the right frontotemporal, left temporal, left frontal, bilateral occipital (left greater than right lobes). Also trace subarachnoid hemorrhage along the left cerebellum. No definite evidence of a subdural or epidural hematoma. No mass effect or midline shift. No hydrocephalus. Basilar cisterns are patent. Vascular: No hyperdense vessel. Atherosclerotic calcifications are present within the cavernous internal carotid arteries. Skull: No acute fracture or focal lesion. Sinuses/Orbits: Mucosal thickening of the left maxillary sinus. Otherwise the remaining visualized paranasal sinuses and mastoid air cells are clear. Bilateral lens replacement. Otherwise the orbits are unremarkable. Other: Right frontoparietal scalp soft tissue edema and trace subgaleal hematoma. CT CERVICAL SPINE FINDINGS Alignment: Normal. Skull  base and vertebrae: No acute fracture. No aggressive appearing focal osseous lesion or focal pathologic process. Soft tissues and spinal canal: No prevertebral fluid or swelling. No visible canal hematoma. Upper chest: Unremarkable. Other: Approximately 3.7 cm hypodense right thyroid gland nodule. IMPRESSION: 1. Moderate volume scattered cerebral subarachnoid hemorrhage as well as trace subarachnoid left cerebellar hemorrhage. 2. No acute displaced fracture or traumatic listhesis of the cervical spine. 3. Approximately 3.7 cm hypodense right thyroid gland nodule. Recommend thyroid US (ref: J Am Coll Radiol. 2015 Feb;12(2): 143-50). However, please note that in the setting of significant comorbidities or limited life expectancy, no follow-up recommended (ref: J Am Coll Radiol. 2015 Feb;12(2): 143-50). These results were called by telephone at the time of interpretation on 03/18/2020 at 6:53 pm to provider PA [facility] LAY[place] , who verbally acknowledged these results. Electronically Signed   By: Iven Finn M.D.   On: 03/18/2020 18:57   CT Chest Wo Contrast  Result Date: 03/18/2020 CLINICAL DATA:  Status post fall, trauma. Palpated large protruding nodule to R lower abd, pt facial grimacing and co of tenderness and pain EXAM: CT CHEST, ABDOMEN AND PELVIS WITHOUT CONTRAST TECHNIQUE: Multidetector CT imaging of the chest, abdomen and pelvis was performed following the standard protocol without IV contrast. COMPARISON:  None. FINDINGS: CHEST: Ports and Devices: None. Lungs/airways: No focal consolidation. A 7 mm right upper lobe pulmonary nodule (5:61). Bilateral lower lobe subsegmental atelectasis. A 4 mm left lower lobe pulmonary nodule (5:67). No pulmonary mass. No pulmonary contusion or laceration. No pneumatocele formation. The central airways are patent. Pleura: No pleural effusion. No pneumothorax. No hemothorax. Lymph Nodes: No mediastinal, hilar, or axillary lymphadenopathy. Mediastinum: No pneumomediastinum.  No largely anterior mediastinal abnormal density. The thoracic aorta is normal in caliber. Aortic valve replacement. Severe mitral annular calcifications. The heart is normal in size. No significant pericardial effusion. The esophagus is unremarkable. 3.6 cm slightly hypodense lesion within the right thyroid gland. Small hiatal hernia. Chest Wall / Breasts: No chest wall mass. Musculoskeletal: Sternotomy wires are noted. No acute displaced rib or sternal fracture. No spinal fracture. Multilevel osteophyte formation. ABDOMEN / PELVIS: Liver: Not enlarged. No focal lesion. No gross injury. Biliary System: Status post cholecystectomy. No biliary ductal dilatation. Pancreas: Normal pancreatic contour. No main pancreatic duct dilatation. Spleen: Not enlarged. No focal lesion. No gross injury. Adrenal Glands: No nodularity bilaterally. Kidneys: Nonspecific bilateral perinephric stranding. No nephrolithiasis bilaterally. No  hydronephrosis. No gross injury. No hydroureter. The urinary bladder is unremarkable. Bowel: No small bowel wall thickening or dilatation. Suggestion of a large third portion of the duodenum/proximal jejunum diverticula (3:59). Diffuse diverticulosis with associated bowel thickening and pericolonic fat stranding of the distal descending colon proximal sigmoid colon. The appendix is unremarkable. Mesentery, Omentum, and Peritoneum: No free-fluid. No pneumoperitoneum. No organized fluid collection. Pelvic Organs: The uterus and bilateral ovaries/adnexal regions are unremarkable. Lymph Nodes: No abdominal, pelvic, inguinal lymphadenopathy. Vasculature: No abdominal aorta or iliac aneurysm. Musculoskeletal: There is a 6 cm round density along the posterior right hip at the level of the iliac crest likely representing a hematoma. Associated surrounding fat stranding. No acute pelvic fracture. No spinal fracture. Multilevel osteophyte formation facet arthropathy. Dense sclerotic lesion within the L5 vertebral  body is indeterminate the likely represents a bone. IMPRESSION: 1. A 6 cm posterior right hip subcutaneus soft tissue hematoma. 2. Distal descending and proximal sigmoid colon acute diverticulitis. Much less likely differential diagnosis includes trauma to the large bowel. Please note limited evaluation for intramural abscess formation on this noncontrast study. 3. Otherwise no other acute traumatic injury to the chest, abdomen, or pelvis with limited evaluation on this noncontrast study. 4. No acute fracture or traumatic malalignment of the thoracic or lumbar spine. Other imaging findings of potential clinical significance: 1. Suggestion of a large third portion of the duodenum/proximal jejunum diverticula. 2. A 7 mm right upper pulmonary nodule. Comparison with prior cross-sectional imaging would be of value to evaluate stability. Non-contrast chest CT at 6-12 months is recommended. If the nodule is stable at time of repeat CT, then future CT at 18-24 months (from today's scan) is considered optional for low-risk patients, but is recommended for high-risk patients. This recommendation follows the consensus statement: Guidelines for Management of Incidental Pulmonary Nodules Detected on CT Images: From the Fleischner Society 2017; Radiology 2017; 284:228-243. 3. A 3.6 cm slightly hypodense lesion within the right thyroid gland. Comparison with prior ultrasound or cross-sectional imaging would be of value to evaluate for stability. Recommend thyroid US (ref: J Am Coll Radiol. 2015 Feb;12(2): 143-50). However, please note that in the setting of significant comorbidities or limited life expectancy, no follow-up recommended (ref: J Am Coll Radiol. 2015 Feb;12(2): 143-50). 4. Aortic Atherosclerosis (ICD10-I70.0) including severe mitral annular calcifications. These results were called by telephone at the time of interpretation on 03/18/2020 at 8:58 pm to provider Dr. Ayesha Rumpf, who verbally acknowledged these results.  Electronically Signed   By: Iven Finn M.D.   On: 03/18/2020 21:18   CT Cervical Spine Wo Contrast  Result Date: 03/18/2020 CLINICAL DATA:  Fall on blood thinners EXAM: CT HEAD WITHOUT CONTRAST CT CERVICAL SPINE WITHOUT CONTRAST TECHNIQUE: Multidetector CT imaging of the head and cervical spine was performed following the standard protocol without intravenous contrast. Multiplanar CT image reconstructions of the cervical spine were also generated. COMPARISON:  None. FINDINGS: CT HEAD FINDINGS Brain: No evidence of large-territorial acute infarction. No parenchymal hemorrhage. No mass lesion. Small to moderate volume subarachnoid hemorrhage of the right frontotemporal, left temporal, left frontal, bilateral occipital (left greater than right lobes). Also trace subarachnoid hemorrhage along the left cerebellum. No definite evidence of a subdural or epidural hematoma. No mass effect or midline shift. No hydrocephalus. Basilar cisterns are patent. Vascular: No hyperdense vessel. Atherosclerotic calcifications are present within the cavernous internal carotid arteries. Skull: No acute fracture or focal lesion. Sinuses/Orbits: Mucosal thickening of the left maxillary sinus. Otherwise the remaining visualized paranasal sinuses  and mastoid air cells are clear. Bilateral lens replacement. Otherwise the orbits are unremarkable. Other: Right frontoparietal scalp soft tissue edema and trace subgaleal hematoma. CT CERVICAL SPINE FINDINGS Alignment: Normal. Skull base and vertebrae: No acute fracture. No aggressive appearing focal osseous lesion or focal pathologic process. Soft tissues and spinal canal: No prevertebral fluid or swelling. No visible canal hematoma. Upper chest: Unremarkable. Other: Approximately 3.7 cm hypodense right thyroid gland nodule. IMPRESSION: 1. Moderate volume scattered cerebral subarachnoid hemorrhage as well as trace subarachnoid left cerebellar hemorrhage. 2. No acute displaced fracture or  traumatic listhesis of the cervical spine. 3. Approximately 3.7 cm hypodense right thyroid gland nodule. Recommend thyroid US (ref: J Am Coll Radiol. 2015 Feb;12(2): 143-50). However, please note that in the setting of significant comorbidities or limited life expectancy, no follow-up recommended (ref: J Am Coll Radiol. 2015 Feb;12(2): 143-50). These results were called by telephone at the time of interpretation on 03/18/2020 at 6:53 pm to provider PA [facility] [place] , who verbally acknowledged these results. Electronically Signed   By: Iven Finn M.D.   On: 03/18/2020 18:57   DG Pelvis Portable  Result Date: 03/18/2020 CLINICAL DATA:  Trauma, fall on blood thinners. EXAM: PORTABLE PELVIS 1-2 VIEWS COMPARISON:  None. FINDINGS: The bones are diffusely under mineralized. The cortical margins of the bony pelvis are intact. No fracture. Pubic symphysis and sacroiliac joints are congruent. Both femoral heads are well-seated in the respective acetabula. IMPRESSION: No pelvic fracture. Electronically Signed   By: Keith Rake M.D.   On: 03/18/2020 18:34   DG Chest Portable 1 View  Result Date: 03/18/2020 CLINICAL DATA:  Trauma, fall on blood thinners. EXAM: PORTABLE CHEST 1 VIEW COMPARISON:  None. FINDINGS: Rotated exam. Median sternotomy and prosthetic cardiac valve. Borderline cardiomegaly. Aortic atherosclerosis. No pneumothorax or large pleural effusion. Calcified granuloma at the left lung base. No acute osseous abnormalities are seen. IMPRESSION: Rotated exam without evidence of acute traumatic injury. Electronically Signed   By: Keith Rake M.D.   On: 03/18/2020 18:33    Review of Systems  Cardiovascular: Positive for chest pain.  Gastrointestinal: Positive for abdominal pain.  Musculoskeletal: Positive for falls, joint pain and myalgias.  All other systems reviewed and are negative.   PE Blood pressure (!) 151/62, pulse 62, temperature 98.4 F (36.9 C), temperature source Oral, resp.  rate 18, height 5' 0.5" (1.537 m), weight 63.5 kg, SpO2 96 %. Constitutional: NAD; conversant; no deformities Eyes: Moist conjunctiva; no lid lag; anicteric; PERRL Neck: Trachea midline; no thyromegaly, no cervicalgia Lungs: Normal respiratory effort; no tactile fremitus CV: RRR; no palpable thrills; no pitting edema GI: Abd soft, right sided hematoma tender to palpation; no palpable hepatosplenomegaly MSK: unable to assess gait; no clubbing/cyanosis Psychiatric: Appropriate affect; alert and oriented x3 Lymphatic: No palpable cervical or axillary lymphadenopathy   Assessment/Plan: 85 yo female injured from standing height fall with several majority medical comorbidities  Abdominal wall hematoma - will follow [facility] - neurosurgery following, recommending seizure prophylaxis, TBI therapies Recommend hospitalist to admit  Procedures: none  Arta Bruce Raciel Caffrey 03/18/2020, 10:30 PM

## 2020-03-18 NOTE — ED Provider Notes (Signed)
[facility] EMERGENCY DEPARTMENT Provider Note   CSN: [id number] Arrival date & time: 03/18/20  1748     History No chief complaint on file.   [name] is a 85 y.o. female brought in by EMS for evaluation of fall.  Patient reports that she was at the grocery store and had a questionable syncopal episode.  EMS reports that bystanders saw patient fall to the ground.  Firefighters watched the security video which showed patient looking to left to the right and then having a syncopal episode.  Patient did hit her head and had positive LOC.  She states she is on aspirin.  No other blood thinners.  On initial EMS arrival, patient was asking some repetitive questions and was slightly confused.  They do report that she became more alert and oriented in route to the ED.  Patient states she does not remember what happens.  She remembers looking at the fruit and then does not recall anything else.  She has no history of seizures.  She states she has been in her normal state of health today.  She does not know when her last and shot was.  She denies any neck pain, chest pain, difficulty breathing, abdominal pain, numbness/weakness of arms or legs.  The history is provided by the patient.       No past medical history on file.  Patient Active Problem List   Diagnosis Date Noted  . Subdural hematoma ([place]) 03/18/2020     The histories are not reviewed yet. Please review them in the "History" navigator section and refresh this [place].   OB History   No obstetric history on file.     No family history on file.     Home Medications Prior to Admission medications   Medication Sig Start Date End Date Taking? Authorizing Provider  allopurinol (ZYLOPRIM) 100 MG tablet Take 200 mg by mouth daily. 02/20/20  Yes [provider]  cholecalciferol (VITAMIN D3) 25 MCG (1000 UNIT) tablet Take 1,000 Units by mouth daily.   Yes [provider]  Ferrous Sulfate (IRON PO)  Take 1 tablet by mouth daily.   Yes [provider]  losartan (COZAAR) 25 MG tablet Take 25 mg by mouth daily. 02/20/20  Yes [provider]  magnesium 30 MG tablet Take 30 mg by mouth daily.   Yes [provider]  metoprolol succinate (TOPROL-XL) 50 MG 24 hr tablet Take 50 mg by mouth daily. 02/16/20  Yes [provider]  Multiple Vitamins-Minerals (MULTIVITAMIN ADULTS) TABS Take 1 tablet by mouth daily.   Yes [provider]  Omega-3 Fatty Acids (FISH OIL) 1000 MG CAPS Take 1 capsule by mouth daily.   Yes [provider]  potassium chloride (KLOR-CON) 10 MEQ tablet Take 10 mEq by mouth every evening. 02/16/20  Yes [provider]  spironolactone (ALDACTONE) 25 MG tablet Take 25 mg by mouth 2 (two) times daily. 01/23/20  Yes [provider]  torsemide (DEMADEX) 20 MG tablet Take 20 mg by mouth 2 (two) times daily. 03/05/20  Yes [provider]  vitamin B-12 (CYANOCOBALAMIN) 1000 MCG tablet Take 1,000 mcg by mouth daily.   Yes [provider]  pravastatin (PRAVACHOL) 80 MG tablet Take 80 mg by mouth daily. 01/11/20   [provider]    Allergies    Januvia [sitagliptin] and Keflex [cephalexin]  Review of Systems   Review of Systems  Constitutional: Negative for fever.  Respiratory: Negative for cough and  shortness of breath.   Cardiovascular: Negative for chest pain.  Gastrointestinal: Negative for abdominal pain, nausea and vomiting.  Genitourinary: Negative for dysuria and hematuria.  Skin: Positive for wound.  Neurological: Positive for headaches.  Psychiatric/Behavioral: Positive for confusion.  All other systems reviewed and are negative.   Physical Exam Updated Vital Signs BP (!) 136/56 (BP Location: Right Arm)   Pulse 63   Temp 98.4 F (36.9 C) (Oral)   Resp 14   Ht 5' 0.5" (1.537 m)   Wt 63.5 kg   SpO2 97%   BMI 26.89 kg/m   Physical Exam Vitals and nursing note reviewed.   Constitutional:      Appearance: Normal appearance. She is well-developed and well-nourished.  HENT:     Head: Normocephalic.      Comments: 3 cm laceration noted to the right forehead that extends into the scalp area.  No surrounding crepitus, skull deformity.    Mouth/Throat:     Mouth: Oropharynx is clear and moist and mucous membranes are normal.  Eyes:     General: Lids are normal.     Extraocular Movements: EOM normal.     Conjunctiva/sclera: Conjunctivae normal.     Pupils: Pupils are equal, round, and reactive to light.     Comments: Right pupil 4 mm and reactive.  Left pupil slightly smaller than baseline.  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  Neck:     Comments: C collar in place. Cardiovascular:     Rate and Rhythm: Normal rate and regular rhythm.     Pulses: Normal pulses.          Radial pulses are 2+ on the right side and 2+ on the left side.       Dorsalis pedis pulses are 2+ on the right side and 2+ on the left side.     Heart sounds: Normal heart sounds. No murmur heard. No friction rub. No gallop.   Pulmonary:     Effort: Pulmonary effort is normal.     Breath sounds: Normal breath sounds.     Comments: Lungs clear to auscultation bilaterally.  Symmetric chest rise.  No wheezing, rales, rhonchi. Chest:     Comments: No anterior chest wall tenderness.  No deformity or crepitus noted.  No evidence of flail chest. Abdominal:     Palpations: Abdomen is soft. Abdomen is not rigid.     Tenderness: There is no abdominal tenderness. There is no guarding.     Comments: Abdomen is soft, non-distended, non-tender. No rigidity, No guarding. No peritoneal signs.  Musculoskeletal:        General: Normal range of motion.     Cervical back: Full passive range of motion without pain.     Comments: No midline T or L-spine tenderness.  No pelvic instability. No tenderness to palpation to bilateral shoulders, clavicles, elbows, and wrists. No deformities or  crepitus noted. FROM of BUE without difficulty. No tenderness to palpation to bilateral knees and ankles. No deformities or crepitus noted. FROM of BLE without any difficulty.   Skin:    General: Skin is warm and dry.     Capillary Refill: Capillary refill takes less than 2 seconds.  Neurological:     Mental Status: She is alert and oriented to person, place, and time.     Comments: Alert and oriented x 3 MAE Cranial nerves III-XII intact Follows commands, Moves all extremities  5/5 strength to BUE and BLE  Sensation intact throughout all major nerve distributions No slurred speech. No facial droop.   Psychiatric:        Mood and Affect: Mood and affect normal.        Speech: Speech normal.     ED Results / Procedures / Treatments   Labs (all labs ordered are listed, but only abnormal results are displayed) Labs Reviewed  COMPREHENSIVE METABOLIC PANEL - Abnormal; Notable for the following components:      Result Value   Glucose, Bld 122 (*)    BUN 51 (*)    Creatinine, Ser 1.58 (*)    GFR, Estimated 32 (*)    All other components within normal limits  CBC WITH DIFFERENTIAL/PLATELET - Abnormal; Notable for the following components:   Hemoglobin 11.8 (*)    RDW 15.9 (*)    All other components within normal limits  CBG MONITORING, ED - Abnormal; Notable for the following components:   Glucose-Capillary 114 (*)    All other components within normal limits  I-STAT CHEM 8, ED - Abnormal; Notable for the following components:   BUN 58 (*)    Creatinine, Ser 1.60 (*)    Glucose, Bld 121 (*)    Calcium, Ion 1.11 (*)    All other components within normal limits  TROPONIN I (HIGH SENSITIVITY)  TROPONIN I (HIGH SENSITIVITY)    EKG EKG Interpretation  Date/Time:  Tuesday March 18 2020 18:46:32 EST Ventricular Rate:  60 PR Interval:    QRS Duration: 131 QT Interval:  485 QTC Calculation: 485 R Axis:   -66 Text Interpretation: Sinus or ectopic atrial rhythm Prolonged PR  interval RBBB and LAFB No old tracing to compare Confirmed by Delora Fuel ([id number]) on 03/18/2020 11:05:26 PM   Radiology CT ABDOMEN PELVIS WO CONTRAST  Result Date: 03/18/2020 CLINICAL DATA:  Status post fall, trauma. Palpated large protruding nodule to R lower abd, pt facial grimacing and co of tenderness and pain EXAM: CT CHEST, ABDOMEN AND PELVIS WITHOUT CONTRAST TECHNIQUE: Multidetector CT imaging of the chest, abdomen and pelvis was performed following the standard protocol without IV contrast. COMPARISON:  None. FINDINGS: CHEST: Ports and Devices: None. Lungs/airways: No focal consolidation. A 7 mm right upper lobe pulmonary nodule (5:61). Bilateral lower lobe subsegmental atelectasis. A 4 mm left lower lobe pulmonary nodule (5:67). No pulmonary mass. No pulmonary contusion or laceration. No pneumatocele formation. The central airways are patent. Pleura: No pleural effusion. No pneumothorax. No hemothorax. Lymph Nodes: No mediastinal, hilar, or axillary lymphadenopathy. Mediastinum: No pneumomediastinum. No largely anterior mediastinal abnormal density. The thoracic aorta is normal in caliber. Aortic valve replacement. Severe mitral annular calcifications. The heart is normal in size. No significant pericardial effusion. The esophagus is unremarkable. 3.6 cm slightly hypodense lesion within the right thyroid gland. Small hiatal hernia. Chest Wall / Breasts: No chest wall mass. Musculoskeletal: Sternotomy wires are noted. No acute displaced rib or sternal fracture. No spinal fracture. Multilevel osteophyte formation. ABDOMEN / PELVIS: Liver: Not enlarged. No focal lesion. No gross injury. Biliary System: Status post cholecystectomy. No biliary ductal dilatation. Pancreas: Normal pancreatic contour. No main pancreatic duct dilatation. Spleen: Not enlarged. No focal lesion. No gross injury. Adrenal Glands: No nodularity bilaterally. Kidneys: Nonspecific bilateral perinephric stranding. No nephrolithiasis  bilaterally. No hydronephrosis. No gross injury. No hydroureter. The urinary bladder is unremarkable. Bowel: No small bowel wall thickening or dilatation. Suggestion of a large third portion of the duodenum/proximal jejunum diverticula (3:59). Diffuse diverticulosis with associated bowel thickening and pericolonic fat  stranding of the distal descending colon proximal sigmoid colon. The appendix is unremarkable. Mesentery, Omentum, and Peritoneum: No free-fluid. No pneumoperitoneum. No organized fluid collection. Pelvic Organs: The uterus and bilateral ovaries/adnexal regions are unremarkable. Lymph Nodes: No abdominal, pelvic, inguinal lymphadenopathy. Vasculature: No abdominal aorta or iliac aneurysm. Musculoskeletal: There is a 6 cm round density along the posterior right hip at the level of the iliac crest likely representing a hematoma. Associated surrounding fat stranding. No acute pelvic fracture. No spinal fracture. Multilevel osteophyte formation facet arthropathy. Dense sclerotic lesion within the L5 vertebral body is indeterminate the likely represents a bone. IMPRESSION: 1. A 6 cm posterior right hip subcutaneus soft tissue hematoma. 2. Distal descending and proximal sigmoid colon acute diverticulitis. Much less likely differential diagnosis includes trauma to the large bowel. Please note limited evaluation for intramural abscess formation on this noncontrast study. 3. Otherwise no other acute traumatic injury to the chest, abdomen, or pelvis with limited evaluation on this noncontrast study. 4. No acute fracture or traumatic malalignment of the thoracic or lumbar spine. Other imaging findings of potential clinical significance: 1. Suggestion of a large third portion of the duodenum/proximal jejunum diverticula. 2. A 7 mm right upper pulmonary nodule. Comparison with prior cross-sectional imaging would be of value to evaluate stability. Non-contrast chest CT at 6-12 months is recommended. If the nodule is  stable at time of repeat CT, then future CT at 18-24 months (from today's scan) is considered optional for low-risk patients, but is recommended for high-risk patients. This recommendation follows the consensus statement: Guidelines for Management of Incidental Pulmonary Nodules Detected on CT Images: From the Fleischner Society 2017; Radiology 2017; 284:228-243. 3. A 3.6 cm slightly hypodense lesion within the right thyroid gland. Comparison with prior ultrasound or cross-sectional imaging would be of value to evaluate for stability. Recommend thyroid US (ref: J Am Coll Radiol. 2015 Feb;12(2): 143-50). However, please note that in the setting of significant comorbidities or limited life expectancy, no follow-up recommended (ref: J Am Coll Radiol. 2015 Feb;12(2): 143-50). 4. Aortic Atherosclerosis (ICD10-I70.0) including severe mitral annular calcifications. These results were called by telephone at the time of interpretation on 03/18/2020 at 8:58 pm to provider Dr. Ayesha Rumpf, who verbally acknowledged these results. Electronically Signed   By: Iven Finn M.D.   On: 03/18/2020 21:18   DG Thoracic Spine 2 View  Result Date: 03/18/2020 CLINICAL DATA:  Back pain. EXAM: THORACIC SPINE 2 VIEWS COMPARISON:  None. FINDINGS: There is no evidence of thoracic spine fracture. Alignment is normal. No other significant bone abnormalities are identified. Prior CABG and AVR. Dense mitral annular calcification. IMPRESSION: 1. Negative. Electronically Signed   By: Titus Dubin M.D.   On: 03/18/2020 19:55   DG Lumbar Spine Complete  Result Date: 03/18/2020 CLINICAL DATA:  Back pain. EXAM: LUMBAR SPINE - COMPLETE 4+ VIEW COMPARISON:  None. FINDINGS: There is no evidence of lumbar spine fracture. Alignment is normal. Intervertebral disc spaces are maintained. L5-S1 facet arthropathy. IMPRESSION: 1. No acute osseous abnormality. 2. L5-S1 facet arthropathy. Electronically Signed   By: Titus Dubin M.D.   On: 03/18/2020 19:57    CT Head Wo Contrast  Result Date: 03/18/2020 CLINICAL DATA:  Fall on blood thinners EXAM: CT HEAD WITHOUT CONTRAST CT CERVICAL SPINE WITHOUT CONTRAST TECHNIQUE: Multidetector CT imaging of the head and cervical spine was performed following the standard protocol without intravenous contrast. Multiplanar CT image reconstructions of the cervical spine were also generated. COMPARISON:  None. FINDINGS: CT HEAD FINDINGS Brain: No  evidence of large-territorial acute infarction. No parenchymal hemorrhage. No mass lesion. Small to moderate volume subarachnoid hemorrhage of the right frontotemporal, left temporal, left frontal, bilateral occipital (left greater than right lobes). Also trace subarachnoid hemorrhage along the left cerebellum. No definite evidence of a subdural or epidural hematoma. No mass effect or midline shift. No hydrocephalus. Basilar cisterns are patent. Vascular: No hyperdense vessel. Atherosclerotic calcifications are present within the cavernous internal carotid arteries. Skull: No acute fracture or focal lesion. Sinuses/Orbits: Mucosal thickening of the left maxillary sinus. Otherwise the remaining visualized paranasal sinuses and mastoid air cells are clear. Bilateral lens replacement. Otherwise the orbits are unremarkable. Other: Right frontoparietal scalp soft tissue edema and trace subgaleal hematoma. CT CERVICAL SPINE FINDINGS Alignment: Normal. Skull base and vertebrae: No acute fracture. No aggressive appearing focal osseous lesion or focal pathologic process. Soft tissues and spinal canal: No prevertebral fluid or swelling. No visible canal hematoma. Upper chest: Unremarkable. Other: Approximately 3.7 cm hypodense right thyroid gland nodule. IMPRESSION: 1. Moderate volume scattered cerebral subarachnoid hemorrhage as well as trace subarachnoid left cerebellar hemorrhage. 2. No acute displaced fracture or traumatic listhesis of the cervical spine. 3. Approximately 3.7 cm hypodense right  thyroid gland nodule. Recommend thyroid US (ref: J Am Coll Radiol. 2015 Feb;12(2): 143-50). However, please note that in the setting of significant comorbidities or limited life expectancy, no follow-up recommended (ref: J Am Coll Radiol. 2015 Feb;12(2): 143-50). These results were called by telephone at the time of interpretation on 03/18/2020 at 6:53 pm to provider PA [facility] Alixander Rallis , who verbally acknowledged these results. Electronically Signed   By: Iven Finn M.D.   On: 03/18/2020 18:57   CT Chest Wo Contrast  Result Date: 03/18/2020 CLINICAL DATA:  Status post fall, trauma. Palpated large protruding nodule to R lower abd, pt facial grimacing and co of tenderness and pain EXAM: CT CHEST, ABDOMEN AND PELVIS WITHOUT CONTRAST TECHNIQUE: Multidetector CT imaging of the chest, abdomen and pelvis was performed following the standard protocol without IV contrast. COMPARISON:  None. FINDINGS: CHEST: Ports and Devices: None. Lungs/airways: No focal consolidation. A 7 mm right upper lobe pulmonary nodule (5:61). Bilateral lower lobe subsegmental atelectasis. A 4 mm left lower lobe pulmonary nodule (5:67). No pulmonary mass. No pulmonary contusion or laceration. No pneumatocele formation. The central airways are patent. Pleura: No pleural effusion. No pneumothorax. No hemothorax. Lymph Nodes: No mediastinal, hilar, or axillary lymphadenopathy. Mediastinum: No pneumomediastinum. No largely anterior mediastinal abnormal density. The thoracic aorta is normal in caliber. Aortic valve replacement. Severe mitral annular calcifications. The heart is normal in size. No significant pericardial effusion. The esophagus is unremarkable. 3.6 cm slightly hypodense lesion within the right thyroid gland. Small hiatal hernia. Chest Wall / Breasts: No chest wall mass. Musculoskeletal: Sternotomy wires are noted. No acute displaced rib or sternal fracture. No spinal fracture. Multilevel osteophyte formation. ABDOMEN / PELVIS: Liver:  Not enlarged. No focal lesion. No gross injury. Biliary System: Status post cholecystectomy. No biliary ductal dilatation. Pancreas: Normal pancreatic contour. No main pancreatic duct dilatation. Spleen: Not enlarged. No focal lesion. No gross injury. Adrenal Glands: No nodularity bilaterally. Kidneys: Nonspecific bilateral perinephric stranding. No nephrolithiasis bilaterally. No hydronephrosis. No gross injury. No hydroureter. The urinary bladder is unremarkable. Bowel: No small bowel wall thickening or dilatation. Suggestion of a large third portion of the duodenum/proximal jejunum diverticula (3:59). Diffuse diverticulosis with associated bowel thickening and pericolonic fat stranding of the distal descending colon proximal sigmoid colon. The appendix is unremarkable. Mesentery, Omentum, and  Peritoneum: No free-fluid. No pneumoperitoneum. No organized fluid collection. Pelvic Organs: The uterus and bilateral ovaries/adnexal regions are unremarkable. Lymph Nodes: No abdominal, pelvic, inguinal lymphadenopathy. Vasculature: No abdominal aorta or iliac aneurysm. Musculoskeletal: There is a 6 cm round density along the posterior right hip at the level of the iliac crest likely representing a hematoma. Associated surrounding fat stranding. No acute pelvic fracture. No spinal fracture. Multilevel osteophyte formation facet arthropathy. Dense sclerotic lesion within the L5 vertebral body is indeterminate the likely represents a bone. IMPRESSION: 1. A 6 cm posterior right hip subcutaneus soft tissue hematoma. 2. Distal descending and proximal sigmoid colon acute diverticulitis. Much less likely differential diagnosis includes trauma to the large bowel. Please note limited evaluation for intramural abscess formation on this noncontrast study. 3. Otherwise no other acute traumatic injury to the chest, abdomen, or pelvis with limited evaluation on this noncontrast study. 4. No acute fracture or traumatic malalignment of  the thoracic or lumbar spine. Other imaging findings of potential clinical significance: 1. Suggestion of a large third portion of the duodenum/proximal jejunum diverticula. 2. A 7 mm right upper pulmonary nodule. Comparison with prior cross-sectional imaging would be of value to evaluate stability. Non-contrast chest CT at 6-12 months is recommended. If the nodule is stable at time of repeat CT, then future CT at 18-24 months (from today's scan) is considered optional for low-risk patients, but is recommended for high-risk patients. This recommendation follows the consensus statement: Guidelines for Management of Incidental Pulmonary Nodules Detected on CT Images: From the Fleischner Society 2017; Radiology 2017; 284:228-243. 3. A 3.6 cm slightly hypodense lesion within the right thyroid gland. Comparison with prior ultrasound or cross-sectional imaging would be of value to evaluate for stability. Recommend thyroid US (ref: J Am Coll Radiol. 2015 Feb;12(2): 143-50). However, please note that in the setting of significant comorbidities or limited life expectancy, no follow-up recommended (ref: J Am Coll Radiol. 2015 Feb;12(2): 143-50). 4. Aortic Atherosclerosis (ICD10-I70.0) including severe mitral annular calcifications. These results were called by telephone at the time of interpretation on 03/18/2020 at 8:58 pm to provider Dr. Ayesha Rumpf, who verbally acknowledged these results. Electronically Signed   By: Iven Finn M.D.   On: 03/18/2020 21:18   CT Cervical Spine Wo Contrast  Result Date: 03/18/2020 CLINICAL DATA:  Fall on blood thinners EXAM: CT HEAD WITHOUT CONTRAST CT CERVICAL SPINE WITHOUT CONTRAST TECHNIQUE: Multidetector CT imaging of the head and cervical spine was performed following the standard protocol without intravenous contrast. Multiplanar CT image reconstructions of the cervical spine were also generated. COMPARISON:  None. FINDINGS: CT HEAD FINDINGS Brain: No evidence of large-territorial acute  infarction. No parenchymal hemorrhage. No mass lesion. Small to moderate volume subarachnoid hemorrhage of the right frontotemporal, left temporal, left frontal, bilateral occipital (left greater than right lobes). Also trace subarachnoid hemorrhage along the left cerebellum. No definite evidence of a subdural or epidural hematoma. No mass effect or midline shift. No hydrocephalus. Basilar cisterns are patent. Vascular: No hyperdense vessel. Atherosclerotic calcifications are present within the cavernous internal carotid arteries. Skull: No acute fracture or focal lesion. Sinuses/Orbits: Mucosal thickening of the left maxillary sinus. Otherwise the remaining visualized paranasal sinuses and mastoid air cells are clear. Bilateral lens replacement. Otherwise the orbits are unremarkable. Other: Right frontoparietal scalp soft tissue edema and trace subgaleal hematoma. CT CERVICAL SPINE FINDINGS Alignment: Normal. Skull base and vertebrae: No acute fracture. No aggressive appearing focal osseous lesion or focal pathologic process. Soft tissues and spinal canal: No prevertebral fluid  or swelling. No visible canal hematoma. Upper chest: Unremarkable. Other: Approximately 3.7 cm hypodense right thyroid gland nodule. IMPRESSION: 1. Moderate volume scattered cerebral subarachnoid hemorrhage as well as trace subarachnoid left cerebellar hemorrhage. 2. No acute displaced fracture or traumatic listhesis of the cervical spine. 3. Approximately 3.7 cm hypodense right thyroid gland nodule. Recommend thyroid US (ref: J Am Coll Radiol. 2015 Feb;12(2): 143-50). However, please note that in the setting of significant comorbidities or limited life expectancy, no follow-up recommended (ref: J Am Coll Radiol. 2015 Feb;12(2): 143-50). These results were called by telephone at the time of interpretation on 03/18/2020 at 6:53 pm to provider PA [facility] Shonta Bourque , who verbally acknowledged these results. Electronically Signed   By: Iven Finn M.D.   On: 03/18/2020 18:57   DG Pelvis Portable  Result Date: 03/18/2020 CLINICAL DATA:  Trauma, fall on blood thinners. EXAM: PORTABLE PELVIS 1-2 VIEWS COMPARISON:  None. FINDINGS: The bones are diffusely under mineralized. The cortical margins of the bony pelvis are intact. No fracture. Pubic symphysis and sacroiliac joints are congruent. Both femoral heads are well-seated in the respective acetabula. IMPRESSION: No pelvic fracture. Electronically Signed   By: Keith Rake M.D.   On: 03/18/2020 18:34   DG Chest Portable 1 View  Result Date: 03/18/2020 CLINICAL DATA:  Trauma, fall on blood thinners. EXAM: PORTABLE CHEST 1 VIEW COMPARISON:  None. FINDINGS: Rotated exam. Median sternotomy and prosthetic cardiac valve. Borderline cardiomegaly. Aortic atherosclerosis. No pneumothorax or large pleural effusion. Calcified granuloma at the left lung base. No acute osseous abnormalities are seen. IMPRESSION: Rotated exam without evidence of acute traumatic injury. Electronically Signed   By: Keith Rake M.D.   On: 03/18/2020 18:33    Procedures .Critical Care Performed by: Volanda Napoleon, PA-C Authorized by: Volanda Napoleon, PA-C   Critical care provider statement:    Critical care time (minutes):  45   Critical care was time spent personally by me on the following activities:  Discussions with consultants, evaluation of patient's response to treatment, examination of patient, ordering and performing treatments and interventions, ordering and review of laboratory studies, ordering and review of radiographic studies, pulse oximetry, re-evaluation of patient's condition, obtaining history from patient or surrogate and review of old charts  .Marland KitchenLaceration Repair  Date/Time: 03/18/2020 11:32 PM Performed by: Volanda Napoleon, PA-C Authorized by: Volanda Napoleon, PA-C   Consent:    Consent obtained:  Verbal   Consent given by:  Patient   Risks discussed:  Infection, need for  additional repair, pain, poor cosmetic result and poor wound healing   Alternatives discussed:  No treatment and delayed treatment Universal protocol:    Procedure explained and questions answered to patient or proxy's satisfaction: yes     Relevant documents present and verified: yes     Test results available: yes     Imaging studies available: yes     Required blood products, implants, devices, and special equipment available: yes     Site/side marked: yes     Immediately prior to procedure, a time out was called: yes     Patient identity confirmed:  Verbally with patient Anesthesia:    Anesthesia method:  Local infiltration   Local anesthetic:  Lidocaine 1% w/o epi Laceration details:    Location:  Scalp   Scalp location:  Frontal   Length (cm):  3 Pre-procedure details:    Preparation:  Patient was prepped and draped in usual sterile fashion Exploration:    Hemostasis  achieved with:  Direct pressure   Wound extent: no foreign bodies/material noted   Treatment:    Area cleansed with:  Povidone-iodine   Amount of cleaning:  Extensive   Irrigation solution:  Sterile saline   Irrigation method:  Syringe   Visualized foreign bodies/material removed: no     Debridement:  None Skin repair:    Repair method:  Sutures   Suture size:  5-0   Suture material:  Prolene   Suture technique:  Simple interrupted   Number of sutures:  6 Approximation:    Approximation:  Close Repair type:    Repair type:  Simple Post-procedure details:    Dressing:  Antibiotic ointment and non-adherent dressing   Procedure completion:  Tolerated Comments:     This area was anesthetized, was thoroughly extensively irrigated.  There was no evidence of foreign body.  Laceration repaired as documented above.  Patient tolerated procedure well.     Medications Ordered in ED Medications  levETIRAcetam (KEPPRA) IVPB 500 mg/100 mL premix (has no administration in time range)  lidocaine (PF) (XYLOCAINE) 1  % injection (has no administration in time range)  Tdap (BOOSTRIX) injection 0.5 mL (0.5 mLs Intramuscular Given 03/18/20 1842)  levETIRAcetam (KEPPRA) IVPB 1000 mg/100 mL premix (0 mg Intravenous Stopped 03/18/20 2134)  labetalol (NORMODYNE) injection 10 mg (10 mg Intravenous Given 03/18/20 2017)  fentaNYL (SUBLIMAZE) injection 50 mcg (50 mcg Intravenous Given 03/18/20 2017)  ondansetron (ZOFRAN) injection 4 mg (4 mg Intravenous Given 03/18/20 2016)  lidocaine-EPINEPHrine (XYLOCAINE W/EPI) 1 %-1:100000 (with pres) injection 10 mL (10 mLs Intradermal Given 03/18/20 2152)    ED Course  I have reviewed the triage vital signs and the nursing notes.  Pertinent labs & imaging results that were available during my care of the patient were reviewed by me and considered in my medical decision making (see chart for details).    MDM Rules/Calculators/A&P                          85 year old female who presents for evaluation via EMS for fall that occurred earlier this evening.  Patient states she remembers going to the grocery store and does not remember what happened.  EMS states that firefighters looked at the security tape and saw patient at the produce section.  They report that she looked both ways and then fell to the ground.  Patient does not recall this.  When EMS initially got there, she was slightly confused, had difficulty and was repeating questions.  They do report some improvement in that by the time she got to the ED.  On my evaluation, she was alert and oriented x3.  She does not remember what happened.  She denies any chest pain, abdominal pain, back pain.  She was reporting pain to her head.  On initial arrival, she is afebrile, vital stable.  Normal neuro exam.  She does have evidence of laceration noted to the right scalp.  While I was in there evaluating her, she started having some focal shaking of just her left thigh.  She states that she felt this but could not stop it.  Question of this was a  partial focal seizure.  This lasted for about 20 seconds and then resolved.  CBC shows no cytosis.  Hemoglobin 7.8.  I-STAT Chem-8 shows BUN 15, creatinine 1.60.  CBG is 114. Trop negative.   CT head shows moderate volume scattered cerebral subarachnoid hemorrhage as well as trace  left left subarachnoid cerebellar hemorrhage.  No acute displaced fracture of the C-spine.  There is a 3.7 cm hypodense thyroid gland nodule noted. XR of L and T spine negative for any acute bony abnormality.   Neurosurgery consulted.   Discussed patient with Dr. Reatha Armour (Neurosurgery).  He will plan to come see the patient.  He recommends starting loading dose of Keppra (that he will order) as well as maintaining blood pressure less than 170. He will plan to consult on patient as needed.   RN inform me that patient had some pain noted to her right lower abdomen and asked me to reassess.  On my evaluation, she had a large hematoma noted to the lower abdomen that that was not present when I initially evaluated her. We will plan for CT abd/pelvis for evaluation.  CT and pelvis shows a 6 cm posterior right hip subcutaneous soft tissue hematoma.  She also has distal descending proximal sigmoid colon acute diverticulitis.  At this time, she does not have any pain on the left lower abdomen.  Do not suspect bowel injury.  Laceration repaired as documented above.  Patient tolerated procedure well.  Discussed with trauma.  They have evaluated patient in the ED. They recommend medicine admit.   Discussed patient with resident with Dr. Andria Frames. Agrees for admission.   Portions of this note were generated with Lobbyist. Dictation errors may occur despite best attempts at proofreading.  Final Clinical Impression(s) / ED Diagnoses Final diagnoses:  Subarachnoid hemorrhage ([place])  Laceration of scalp, initial encounter  Subcutaneous hematoma    Rx / DC Orders ED Discharge Orders    None       Desma Mcgregor 03/18/20 2336    Quintella Reichert, MD 03/22/20 1444

## 2020-03-18 NOTE — Progress Notes (Signed)
Orthopedic Tech Progress Note Patient Details:  [name] 03/24/1935 [id number] Level 2 trauma Patient ID: [name], female   DOB: 11/01/1935, 85 y.o.   MRN: [id number]   [name] 03/18/2020, 6:07 PM

## 2020-03-18 NOTE — H&P (Incomplete)
[facility] Admission History and Physical Service Pager: [phone]  Patient name: [name] record number: [id number] Date of birth: 02-02-1935 Age: 85 y.o. Gender: female  Primary Care Provider: Verdell Carmine., MD Consultants: *** Neurosurgery Code Status: *** which was confirmed with family if patient unable to confirm ***  Preferred Emergency Contact: ***  Chief Complaint: ***  Assessment and Plan: [name] is a 85 y.o. female presenting with *** . PMH is significant for CABG, cholecystectomy  FMhx of heart disease in her father, DM in mother and gmother  ***  CKD   CHF  DM, diet controlled Ketogenic diet, goal 18 carbs per day  Gout   FEN/GI: *** Prophylaxis: ***  Disposition: ***  History of Present Illness:  [name] is a 85 y.o. female presenting with ***  Able to see security camera fall, no recent illness and doesn't remember this episode. Hx of dizziness/vertigo with falls???  Lac to R scalp  20sec episode with L thigh shaking Goal to maintain BP  Abdominal pain - hematoma new subQ. Trauma evaluated with no plans to follow up.   [name] has felt well for the past several days.  Earlier today she stopped at the grocery store and ended up falling during her shopping.  She has had a lot of trouble remembering what happened during her time in the grocery store and to experience of the fall.  She does not appear to have tripped, she simply fell to the floor in the grocery store.  She has not previously had any trouble with falls.    She does occasionally have pain in her feet from gout but otherwise does not have trouble with ambulation.  Daughter lives next door but pt does not need any help living at home. She is entirely independent with her living situation.   Review Of Systems: Per HPI with the following additions: ***  Review of Systems   There are no problems to display for this patient.   Past Medical  History: No past medical history on file.  Past Surgical History: *** The histories are not reviewed yet. Please review them in the "History" navigator section and refresh this [place].  Social History:   Additional social history: ***  Please also refer to relevant sections of EMR.  Family History: No family history on file. (***If not completed, MUST add something in)  Allergies and Medications: Allergies  Allergen Reactions  . Januvia [Sitagliptin] Rash  . Keflex [Cephalexin] Rash   No current facility-administered medications on file prior to encounter.   Current Outpatient Medications on File Prior to Encounter  Medication Sig Dispense Refill  . allopurinol (ZYLOPRIM) 100 MG tablet Take 200 mg by mouth daily.    . cholecalciferol (VITAMIN D3) 25 MCG (1000 UNIT) tablet Take 1,000 Units by mouth daily.    . Ferrous Sulfate (IRON PO) Take 1 tablet by mouth daily.    [name] losartan (COZAAR) 25 MG tablet Take 25 mg by mouth daily.    . magnesium 30 MG tablet Take 30 mg by mouth daily.    . metoprolol succinate (TOPROL-XL) 50 MG 24 hr tablet Take 50 mg by mouth daily.    . Multiple Vitamins-Minerals (MULTIVITAMIN ADULTS) TABS Take 1 tablet by mouth daily.    . Omega-3 Fatty Acids (FISH OIL) 1000 MG CAPS Take 1 capsule by mouth daily.    . potassium chloride (KLOR-CON) 10 MEQ tablet Take 10 mEq by mouth every evening.    [name]  spironolactone (ALDACTONE) 25 MG tablet Take 25 mg by mouth 2 (two) times daily.    [name] torsemide (DEMADEX) 20 MG tablet Take 20 mg by mouth 2 (two) times daily.    . vitamin B-12 (CYANOCOBALAMIN) 1000 MCG tablet Take 1,000 mcg by mouth daily.    . pravastatin (PRAVACHOL) 80 MG tablet Take 80 mg by mouth daily.      Objective: BP (!) 151/62   Pulse 62   Temp 98.4 F (36.9 C) (Oral)   Resp 18   Ht 5' 0.5" (1.537 m)   Wt 63.5 kg   SpO2 96%   BMI 26.89 kg/m  Exam: General: *** Eyes: *** ENTM: *** Neck: *** Cardiovascular: *** Respiratory:  *** Gastrointestinal: *** MSK: *** Derm: *** Neuro: *** Psych: ***  Labs and Imaging: CBC BMET  Recent Labs  Lab 03/18/20 1759 03/18/20 1819  WBC 9.4  --   HGB 11.8* 12.2  HCT 38.8 36.0  PLT 268  --    Recent Labs  Lab 03/18/20 1759 03/18/20 1819  NA 138 139  K 4.6 4.5  CL 104 104  CO2 22  --   BUN 51* 58*  CREATININE 1.58* 1.60*  GLUCOSE 122* 121*  CALCIUM 9.7  --      EKG: My own interpretation (not copied from electronic read) ***   ***  Rise Patience, DO 03/18/2020, 11:19 PM PGY-1, [place] [place] Intern pager: [phone], text pages welcome

## 2020-03-19 ENCOUNTER — Other Ambulatory Visit: Payer: Self-pay | Admitting: Family Medicine

## 2020-03-19 ENCOUNTER — Inpatient Hospital Stay (HOSPITAL_COMMUNITY): Payer: Medicare Other

## 2020-03-19 ENCOUNTER — Inpatient Hospital Stay (HOSPITAL_BASED_OUTPATIENT_CLINIC_OR_DEPARTMENT_OTHER): Payer: Medicare Other

## 2020-03-19 DIAGNOSIS — I609 Nontraumatic subarachnoid hemorrhage, unspecified: Principal | ICD-10-CM

## 2020-03-19 DIAGNOSIS — E119 Type 2 diabetes mellitus without complications: Secondary | ICD-10-CM | POA: Insufficient documentation

## 2020-03-19 DIAGNOSIS — R55 Syncope and collapse: Secondary | ICD-10-CM

## 2020-03-19 DIAGNOSIS — I361 Nonrheumatic tricuspid (valve) insufficiency: Secondary | ICD-10-CM

## 2020-03-19 DIAGNOSIS — I350 Nonrheumatic aortic (valve) stenosis: Secondary | ICD-10-CM

## 2020-03-19 DIAGNOSIS — T148XXA Other injury of unspecified body region, initial encounter: Secondary | ICD-10-CM | POA: Insufficient documentation

## 2020-03-19 DIAGNOSIS — S0101XA Laceration without foreign body of scalp, initial encounter: Secondary | ICD-10-CM | POA: Insufficient documentation

## 2020-03-19 HISTORY — DX: Nontraumatic subarachnoid hemorrhage, unspecified: I60.9

## 2020-03-19 LAB — ECHOCARDIOGRAM COMPLETE
AR max vel: 1.19 cm2
AV Area VTI: 1.21 cm2
AV Area mean vel: 1.2 cm2
AV Mean grad: 18 mmHg
AV Peak grad: 32.5 mmHg
Ao pk vel: 2.85 m/s
Area-P 1/2: 2.59 cm2
Height: 60.5 in
S' Lateral: 3 cm
Weight: 2240 oz

## 2020-03-19 LAB — HEMOGLOBIN A1C
Hgb A1c MFr Bld: 6.3 % — ABNORMAL HIGH (ref 4.8–5.6)
Mean Plasma Glucose: 134.11 mg/dL

## 2020-03-19 LAB — CBC
HCT: 33.1 % — ABNORMAL LOW (ref 36.0–46.0)
Hemoglobin: 10.6 g/dL — ABNORMAL LOW (ref 12.0–15.0)
MCH: 29 pg (ref 26.0–34.0)
MCHC: 32 g/dL (ref 30.0–36.0)
MCV: 90.7 fL (ref 80.0–100.0)
Platelets: 269 10*3/uL (ref 150–400)
RBC: 3.65 MIL/uL — ABNORMAL LOW (ref 3.87–5.11)
RDW: 15.9 % — ABNORMAL HIGH (ref 11.5–15.5)
WBC: 14.4 10*3/uL — ABNORMAL HIGH (ref 4.0–10.5)
nRBC: 0 % (ref 0.0–0.2)

## 2020-03-19 LAB — BASIC METABOLIC PANEL
Anion gap: 13 (ref 5–15)
BUN: 51 mg/dL — ABNORMAL HIGH (ref 8–23)
CO2: 21 mmol/L — ABNORMAL LOW (ref 22–32)
Calcium: 9.3 mg/dL (ref 8.9–10.3)
Chloride: 103 mmol/L (ref 98–111)
Creatinine, Ser: 1.48 mg/dL — ABNORMAL HIGH (ref 0.44–1.00)
GFR, Estimated: 35 mL/min — ABNORMAL LOW (ref >60)
Glucose, Bld: 145 mg/dL — ABNORMAL HIGH (ref 70–99)
Potassium: 4.6 mmol/L (ref 3.5–5.1)
Sodium: 137 mmol/L (ref 135–145)

## 2020-03-19 LAB — CBG MONITORING, ED
Glucose-Capillary: 127 mg/dL — ABNORMAL HIGH (ref 70–99)
Glucose-Capillary: 117 mg/dL — ABNORMAL HIGH (ref 70–99)
Glucose-Capillary: 115 mg/dL — ABNORMAL HIGH (ref 70–99)

## 2020-03-19 LAB — TSH: TSH: 1.002 u[IU]/mL (ref 0.350–4.500)

## 2020-03-19 LAB — PROTIME-INR
INR: 1.2 (ref 0.8–1.2)
Prothrombin Time: 14.3 seconds (ref 11.4–15.2)

## 2020-03-19 LAB — BRAIN NATRIURETIC PEPTIDE: B Natriuretic Peptide: 332.7 pg/mL — ABNORMAL HIGH (ref 0.0–100.0)

## 2020-03-19 MED ORDER — SPIRONOLACTONE 25 MG PO TABS
25.0000 mg | ORAL_TABLET | Freq: Two times a day (BID) | ORAL | Status: DC
  Administered 2020-03-19 (×2): 25 mg via ORAL
  Filled 2020-03-19 (×3): qty 1

## 2020-03-19 MED ORDER — PRAVASTATIN SODIUM 40 MG PO TABS
80.0000 mg | ORAL_TABLET | Freq: Every day | ORAL | Status: DC
  Administered 2020-03-19: 80 mg via ORAL
  Filled 2020-03-19 (×2): qty 2

## 2020-03-19 MED ORDER — ACETAMINOPHEN 650 MG RE SUPP: 650.0000 mg | Freq: Four times a day (QID) | RECTAL | Status: DC | PRN

## 2020-03-19 MED ORDER — ACETAMINOPHEN 325 MG PO TABS
650.0000 mg | ORAL_TABLET | Freq: Four times a day (QID) | ORAL | Status: DC | PRN
  Administered 2020-03-19 (×2): 650 mg via ORAL
  Filled 2020-03-19 (×2): qty 2

## 2020-03-19 MED ORDER — LEVETIRACETAM 500 MG PO TABS
500.0000 mg | ORAL_TABLET | Freq: Once | ORAL | Status: AC
  Administered 2020-03-19: 500 mg via ORAL
  Filled 2020-03-19: qty 1

## 2020-03-19 MED ORDER — TORSEMIDE 20 MG PO TABS
20.0000 mg | ORAL_TABLET | Freq: Two times a day (BID) | ORAL | Status: DC
  Administered 2020-03-19: 20 mg via ORAL
  Filled 2020-03-19: qty 1

## 2020-03-19 MED ORDER — METOPROLOL SUCCINATE ER 25 MG PO TB24
50.0000 mg | ORAL_TABLET | Freq: Every day | ORAL | Status: DC
  Administered 2020-03-19: 50 mg via ORAL
  Filled 2020-03-19: qty 2

## 2020-03-19 MED ORDER — ALLOPURINOL 100 MG PO TABS
200.0000 mg | ORAL_TABLET | Freq: Every day | ORAL | Status: DC
  Administered 2020-03-19: 200 mg via ORAL
  Filled 2020-03-19: qty 2

## 2020-03-19 MED ORDER — VITAMIN B-12 1000 MCG PO TABS
1000.0000 ug | ORAL_TABLET | Freq: Every day | ORAL | Status: DC
  Administered 2020-03-19: 1000 ug via ORAL
  Filled 2020-03-19: qty 1

## 2020-03-19 MED ORDER — SODIUM CHLORIDE 0.9% FLUSH
3.0000 mL | Freq: Two times a day (BID) | INTRAVENOUS | Status: DC
  Administered 2020-03-19 (×2): 3 mL via INTRAVENOUS

## 2020-03-19 MED ORDER — SODIUM CHLORIDE 0.9 % IV SOLN: INTRAVENOUS | Status: DC

## 2020-03-19 MED ORDER — MAGNESIUM OXIDE 400 (241.3 MG) MG PO TABS
200.0000 mg | ORAL_TABLET | Freq: Every day | ORAL | Status: DC
  Administered 2020-03-19: 200 mg via ORAL
  Filled 2020-03-19: qty 1

## 2020-03-19 MED ORDER — LEVETIRACETAM 500 MG PO TABS: 500.0000 mg | ORAL_TABLET | Freq: Once | ORAL | Status: DC

## 2020-03-19 MED ORDER — LEVETIRACETAM 500 MG PO TABS: 500.0000 mg | ORAL_TABLET | Freq: Two times a day (BID) | ORAL | 0 refills | Status: DC

## 2020-03-19 MED ORDER — INSULIN ASPART 100 UNIT/ML ~~LOC~~ SOLN: 0.0000 [IU] | Freq: Three times a day (TID) | SUBCUTANEOUS | Status: DC

## 2020-03-19 MED ORDER — LOSARTAN POTASSIUM 50 MG PO TABS
25.0000 mg | ORAL_TABLET | Freq: Every day | ORAL | Status: DC
  Administered 2020-03-19: 25 mg via ORAL
  Filled 2020-03-19: qty 1

## 2020-03-19 MED ORDER — POLYETHYLENE GLYCOL 3350 17 G PO PACK: 17.0000 g | PACK | Freq: Every day | ORAL | Status: DC | PRN

## 2020-03-19 NOTE — Progress Notes (Signed)
[facility] Progress Note     Subjective: CC-  Comfortable this morning. Sitting up in bed eating breakfast. She reports a mild headache some some discomfort in her right lower abdominal area. Denies n/v or diffuse abdominal pain. She has only taken tylenol for pain. No new injuries noted.   Lives at home alone. Daughter lives close by. Ambulates without assistive device Has 16 stairs at home which she climbs daily  Objective: Vital signs in last 24 hours: Temp:  [98.4 F (36.9 C)] 98.4 F (36.9 C) (03/09 0834) Pulse Rate:  [57-70] 63 (03/09 0845) Resp:  [12-23] 23 (03/09 0845) BP: (108-187)/(47-72) 144/59 (03/09 0845) SpO2:  [93 %-100 %] 97 % (03/09 0845) Weight:  [63.5 kg] 63.5 kg (03/08 1824)    Intake/Output from previous day: 03/08 0701 - 03/09 0700 In: 100 [IV Piggyback:100] Out: -  Intake/Output this shift: No intake/output data recorded.  PE: Gen:  Alert, NAD, pleasant HEENT: EOM's intact, pupils equal and round, right forehead/scalp lac s/p repair with prolene suture in place Card:  RRR, no M/G/R heard, palpable pedal pulses bilaterally Pulm:  CTAB, no W/R/R, rate and effort normal Abd: Soft, ND, no diffuse abdominal tenderness, focally tender over hematoma in the RLQ, +BS Ext:  No gross deformity BUE/BLE, calves soft and nontender Psych: A&Ox4 although still does not remember falling/ she remembers waking up in the ambulance  Skin: no rashes noted, warm and dry  Lab Results:  Recent Labs    03/18/20 1759 03/18/20 1819 03/19/20 0422  WBC 9.4  --  14.4*  HGB 11.8* 12.2 10.6*  HCT 38.8 36.0 33.1*  PLT 268  --  269   BMET Recent Labs    03/18/20 1759 03/18/20 1819 03/19/20 0422  NA 138 139 137  K 4.6 4.5 4.6  CL 104 104 103  CO2 22  --  21*  GLUCOSE 122* 121* 145*  BUN 51* 58* 51*  CREATININE 1.58* 1.60* 1.48*  CALCIUM 9.7  --  9.3   PT/INR Recent Labs    03/19/20 0155  LABPROT 14.3  INR 1.2   CMP     Component Value  Date/Time   NA 137 03/19/2020 0422   K 4.6 03/19/2020 0422   CL 103 03/19/2020 0422   CO2 21 (L) 03/19/2020 0422   GLUCOSE 145 (H) 03/19/2020 0422   BUN 51 (H) 03/19/2020 0422   CREATININE 1.48 (H) 03/19/2020 0422   CALCIUM 9.3 03/19/2020 0422   PROT 7.1 03/18/2020 1759   ALBUMIN 3.7 03/18/2020 1759   AST 26 03/18/2020 1759   ALT 22 03/18/2020 1759   ALKPHOS 50 03/18/2020 1759   BILITOT 0.6 03/18/2020 1759   GFRNONAA 35 (L) 03/19/2020 0422   Lipase  No results found for: LIPASE     Studies/Results: CT ABDOMEN PELVIS WO CONTRAST  Result Date: 03/18/2020 CLINICAL DATA:  Status post fall, trauma. Palpated large protruding nodule to R lower abd, pt facial grimacing and co of tenderness and pain EXAM: CT CHEST, ABDOMEN AND PELVIS WITHOUT CONTRAST TECHNIQUE: Multidetector CT imaging of the chest, abdomen and pelvis was performed following the standard protocol without IV contrast. COMPARISON:  None. FINDINGS: CHEST: Ports and Devices: None. Lungs/airways: No focal consolidation. A 7 mm right upper lobe pulmonary nodule (5:61). Bilateral lower lobe subsegmental atelectasis. A 4 mm left lower lobe pulmonary nodule (5:67). No pulmonary mass. No pulmonary contusion or laceration. No pneumatocele formation. The central airways are patent. Pleura: No pleural effusion. No pneumothorax. No hemothorax.  Lymph Nodes: No mediastinal, hilar, or axillary lymphadenopathy. Mediastinum: No pneumomediastinum. No largely anterior mediastinal abnormal density. The thoracic aorta is normal in caliber. Aortic valve replacement. Severe mitral annular calcifications. The heart is normal in size. No significant pericardial effusion. The esophagus is unremarkable. 3.6 cm slightly hypodense lesion within the right thyroid gland. Small hiatal hernia. Chest Wall / Breasts: No chest wall mass. Musculoskeletal: Sternotomy wires are noted. No acute displaced rib or sternal fracture. No spinal fracture. Multilevel osteophyte  formation. ABDOMEN / PELVIS: Liver: Not enlarged. No focal lesion. No gross injury. Biliary System: Status post cholecystectomy. No biliary ductal dilatation. Pancreas: Normal pancreatic contour. No main pancreatic duct dilatation. Spleen: Not enlarged. No focal lesion. No gross injury. Adrenal Glands: No nodularity bilaterally. Kidneys: Nonspecific bilateral perinephric stranding. No nephrolithiasis bilaterally. No hydronephrosis. No gross injury. No hydroureter. The urinary bladder is unremarkable. Bowel: No small bowel wall thickening or dilatation. Suggestion of a large third portion of the duodenum/proximal jejunum diverticula (3:59). Diffuse diverticulosis with associated bowel thickening and pericolonic fat stranding of the distal descending colon proximal sigmoid colon. The appendix is unremarkable. Mesentery, Omentum, and Peritoneum: No free-fluid. No pneumoperitoneum. No organized fluid collection. Pelvic Organs: The uterus and bilateral ovaries/adnexal regions are unremarkable. Lymph Nodes: No abdominal, pelvic, inguinal lymphadenopathy. Vasculature: No abdominal aorta or iliac aneurysm. Musculoskeletal: There is a 6 cm round density along the posterior right hip at the level of the iliac crest likely representing a hematoma. Associated surrounding fat stranding. No acute pelvic fracture. No spinal fracture. Multilevel osteophyte formation facet arthropathy. Dense sclerotic lesion within the L5 vertebral body is indeterminate the likely represents a bone. IMPRESSION: 1. A 6 cm posterior right hip subcutaneus soft tissue hematoma. 2. Distal descending and proximal sigmoid colon acute diverticulitis. Much less likely differential diagnosis includes trauma to the large bowel. Please note limited evaluation for intramural abscess formation on this noncontrast study. 3. Otherwise no other acute traumatic injury to the chest, abdomen, or pelvis with limited evaluation on this noncontrast study. 4. No acute  fracture or traumatic malalignment of the thoracic or lumbar spine. Other imaging findings of potential clinical significance: 1. Suggestion of a large third portion of the duodenum/proximal jejunum diverticula. 2. A 7 mm right upper pulmonary nodule. Comparison with prior cross-sectional imaging would be of value to evaluate stability. Non-contrast chest CT at 6-12 months is recommended. If the nodule is stable at time of repeat CT, then future CT at 18-24 months (from today's scan) is considered optional for low-risk patients, but is recommended for high-risk patients. This recommendation follows the consensus statement: Guidelines for Management of Incidental Pulmonary Nodules Detected on CT Images: From the Fleischner Society 2017; Radiology 2017; 284:228-243. 3. A 3.6 cm slightly hypodense lesion within the right thyroid gland. Comparison with prior ultrasound or cross-sectional imaging would be of value to evaluate for stability. Recommend thyroid US (ref: J Am Coll Radiol. 2015 Feb;12(2): 143-50). However, please note that in the setting of significant comorbidities or limited life expectancy, no follow-up recommended (ref: J Am Coll Radiol. 2015 Feb;12(2): 143-50). 4. Aortic Atherosclerosis (ICD10-I70.0) including severe mitral annular calcifications. These results were called by telephone at the time of interpretation on 03/18/2020 at 8:58 pm to provider Dr. Ayesha Rumpf, who verbally acknowledged these results. Electronically Signed   By: Iven Finn M.D.   On: 03/18/2020 21:18   DG Thoracic Spine 2 View  Result Date: 03/18/2020 CLINICAL DATA:  Back pain. EXAM: THORACIC SPINE 2 VIEWS COMPARISON:  None. FINDINGS: There  is no evidence of thoracic spine fracture. Alignment is normal. No other significant bone abnormalities are identified. Prior CABG and AVR. Dense mitral annular calcification. IMPRESSION: 1. Negative. Electronically Signed   By: Titus Dubin M.D.   On: 03/18/2020 19:55   DG Lumbar Spine  Complete  Result Date: 03/18/2020 CLINICAL DATA:  Back pain. EXAM: LUMBAR SPINE - COMPLETE 4+ VIEW COMPARISON:  None. FINDINGS: There is no evidence of lumbar spine fracture. Alignment is normal. Intervertebral disc spaces are maintained. L5-S1 facet arthropathy. IMPRESSION: 1. No acute osseous abnormality. 2. L5-S1 facet arthropathy. Electronically Signed   By: Titus Dubin M.D.   On: 03/18/2020 19:57   CT HEAD WO CONTRAST  Result Date: 03/19/2020 CLINICAL DATA:  85 year old female status post fall on blood thinners with small volume subarachnoid hemorrhage. Subsequent encounter. EXAM: CT HEAD WITHOUT CONTRAST TECHNIQUE: Contiguous axial images were obtained from the base of the skull through the vertex without intravenous contrast. COMPARISON:  Head CT 03/18/2020. FINDINGS: Brain: Scattered bilateral small volume subarachnoid hemorrhage, relatively sparing the basilar cisterns as before. Decreased volume of blood in the right sylvian fissure since yesterday. Essentially stable SAH otherwise. Trace cerebellar folia SAH again noted. Trace intraventricular blood is apparent now on series 3, image 16. No convincing hemorrhagic contusion. Normal underlying cerebral volume. No midline shift, ventriculomegaly, mass effect, evidence of mass lesion or evidence of cortically based acute infarction. Vascular: Extensive Calcified atherosclerosis at the skull base. No suspicious intracranial vascular hyperdensity. Skull: Stable and intact.  Hyperostosis frontalis. Sinuses/Orbits: Visualized paranasal sinuses and mastoids are stable and well pneumatized. Other: Right lateral scalp hematoma has regressed. Stable orbits soft tissues. IMPRESSION: 1. Small volume posttraumatic subarachnoid hemorrhage has slightly decreased since yesterday. Mild redistribution, including trace IVH now. 2. No new traumatic injury to the brain identified. No new intracranial abnormality. 3. Right lateral scalp hematoma. Electronically Signed    By: Genevie Ann M.D.   On: 03/19/2020 06:17   CT Head Wo Contrast  Result Date: 03/18/2020 CLINICAL DATA:  Fall on blood thinners EXAM: CT HEAD WITHOUT CONTRAST CT CERVICAL SPINE WITHOUT CONTRAST TECHNIQUE: Multidetector CT imaging of the head and cervical spine was performed following the standard protocol without intravenous contrast. Multiplanar CT image reconstructions of the cervical spine were also generated. COMPARISON:  None. FINDINGS: CT HEAD FINDINGS Brain: No evidence of large-territorial acute infarction. No parenchymal hemorrhage. No mass lesion. Small to moderate volume subarachnoid hemorrhage of the right frontotemporal, left temporal, left frontal, bilateral occipital (left greater than right lobes). Also trace subarachnoid hemorrhage along the left cerebellum. No definite evidence of a subdural or epidural hematoma. No mass effect or midline shift. No hydrocephalus. Basilar cisterns are patent. Vascular: No hyperdense vessel. Atherosclerotic calcifications are present within the cavernous internal carotid arteries. Skull: No acute fracture or focal lesion. Sinuses/Orbits: Mucosal thickening of the left maxillary sinus. Otherwise the remaining visualized paranasal sinuses and mastoid air cells are clear. Bilateral lens replacement. Otherwise the orbits are unremarkable. Other: Right frontoparietal scalp soft tissue edema and trace subgaleal hematoma. CT CERVICAL SPINE FINDINGS Alignment: Normal. Skull base and vertebrae: No acute fracture. No aggressive appearing focal osseous lesion or focal pathologic process. Soft tissues and spinal canal: No prevertebral fluid or swelling. No visible canal hematoma. Upper chest: Unremarkable. Other: Approximately 3.7 cm hypodense right thyroid gland nodule. IMPRESSION: 1. Moderate volume scattered cerebral subarachnoid hemorrhage as well as trace subarachnoid left cerebellar hemorrhage. 2. No acute displaced fracture or traumatic listhesis of the cervical spine.  3. Approximately  3.7 cm hypodense right thyroid gland nodule. Recommend thyroid US (ref: J Am Coll Radiol. 2015 Feb;12(2): 143-50). However, please note that in the setting of significant comorbidities or limited life expectancy, no follow-up recommended (ref: J Am Coll Radiol. 2015 Feb;12(2): 143-50). These results were called by telephone at the time of interpretation on 03/18/2020 at 6:53 pm to provider PA [facility] [place] , who verbally acknowledged these results. Electronically Signed   By: Iven Finn M.D.   On: 03/18/2020 18:57   CT Chest Wo Contrast  Result Date: 03/18/2020 CLINICAL DATA:  Status post fall, trauma. Palpated large protruding nodule to R lower abd, pt facial grimacing and co of tenderness and pain EXAM: CT CHEST, ABDOMEN AND PELVIS WITHOUT CONTRAST TECHNIQUE: Multidetector CT imaging of the chest, abdomen and pelvis was performed following the standard protocol without IV contrast. COMPARISON:  None. FINDINGS: CHEST: Ports and Devices: None. Lungs/airways: No focal consolidation. A 7 mm right upper lobe pulmonary nodule (5:61). Bilateral lower lobe subsegmental atelectasis. A 4 mm left lower lobe pulmonary nodule (5:67). No pulmonary mass. No pulmonary contusion or laceration. No pneumatocele formation. The central airways are patent. Pleura: No pleural effusion. No pneumothorax. No hemothorax. Lymph Nodes: No mediastinal, hilar, or axillary lymphadenopathy. Mediastinum: No pneumomediastinum. No largely anterior mediastinal abnormal density. The thoracic aorta is normal in caliber. Aortic valve replacement. Severe mitral annular calcifications. The heart is normal in size. No significant pericardial effusion. The esophagus is unremarkable. 3.6 cm slightly hypodense lesion within the right thyroid gland. Small hiatal hernia. Chest Wall / Breasts: No chest wall mass. Musculoskeletal: Sternotomy wires are noted. No acute displaced rib or sternal fracture. No spinal fracture. Multilevel  osteophyte formation. ABDOMEN / PELVIS: Liver: Not enlarged. No focal lesion. No gross injury. Biliary System: Status post cholecystectomy. No biliary ductal dilatation. Pancreas: Normal pancreatic contour. No main pancreatic duct dilatation. Spleen: Not enlarged. No focal lesion. No gross injury. Adrenal Glands: No nodularity bilaterally. Kidneys: Nonspecific bilateral perinephric stranding. No nephrolithiasis bilaterally. No hydronephrosis. No gross injury. No hydroureter. The urinary bladder is unremarkable. Bowel: No small bowel wall thickening or dilatation. Suggestion of a large third portion of the duodenum/proximal jejunum diverticula (3:59). Diffuse diverticulosis with associated bowel thickening and pericolonic fat stranding of the distal descending colon proximal sigmoid colon. The appendix is unremarkable. Mesentery, Omentum, and Peritoneum: No free-fluid. No pneumoperitoneum. No organized fluid collection. Pelvic Organs: The uterus and bilateral ovaries/adnexal regions are unremarkable. Lymph Nodes: No abdominal, pelvic, inguinal lymphadenopathy. Vasculature: No abdominal aorta or iliac aneurysm. Musculoskeletal: There is a 6 cm round density along the posterior right hip at the level of the iliac crest likely representing a hematoma. Associated surrounding fat stranding. No acute pelvic fracture. No spinal fracture. Multilevel osteophyte formation facet arthropathy. Dense sclerotic lesion within the L5 vertebral body is indeterminate the likely represents a bone. IMPRESSION: 1. A 6 cm posterior right hip subcutaneus soft tissue hematoma. 2. Distal descending and proximal sigmoid colon acute diverticulitis. Much less likely differential diagnosis includes trauma to the large bowel. Please note limited evaluation for intramural abscess formation on this noncontrast study. 3. Otherwise no other acute traumatic injury to the chest, abdomen, or pelvis with limited evaluation on this noncontrast study. 4. No  acute fracture or traumatic malalignment of the thoracic or lumbar spine. Other imaging findings of potential clinical significance: 1. Suggestion of a large third portion of the duodenum/proximal jejunum diverticula. 2. A 7 mm right upper pulmonary nodule. Comparison with prior cross-sectional imaging would be of  value to evaluate stability. Non-contrast chest CT at 6-12 months is recommended. If the nodule is stable at time of repeat CT, then future CT at 18-24 months (from today's scan) is considered optional for low-risk patients, but is recommended for high-risk patients. This recommendation follows the consensus statement: Guidelines for Management of Incidental Pulmonary Nodules Detected on CT Images: From the Fleischner Society 2017; Radiology 2017; 284:228-243. 3. A 3.6 cm slightly hypodense lesion within the right thyroid gland. Comparison with prior ultrasound or cross-sectional imaging would be of value to evaluate for stability. Recommend thyroid US (ref: J Am Coll Radiol. 2015 Feb;12(2): 143-50). However, please note that in the setting of significant comorbidities or limited life expectancy, no follow-up recommended (ref: J Am Coll Radiol. 2015 Feb;12(2): 143-50). 4. Aortic Atherosclerosis (ICD10-I70.0) including severe mitral annular calcifications. These results were called by telephone at the time of interpretation on 03/18/2020 at 8:58 pm to provider Dr. Ayesha Rumpf, who verbally acknowledged these results. Electronically Signed   By: Iven Finn M.D.   On: 03/18/2020 21:18   CT Cervical Spine Wo Contrast  Result Date: 03/18/2020 CLINICAL DATA:  Fall on blood thinners EXAM: CT HEAD WITHOUT CONTRAST CT CERVICAL SPINE WITHOUT CONTRAST TECHNIQUE: Multidetector CT imaging of the head and cervical spine was performed following the standard protocol without intravenous contrast. Multiplanar CT image reconstructions of the cervical spine were also generated. COMPARISON:  None. FINDINGS: CT HEAD FINDINGS  Brain: No evidence of large-territorial acute infarction. No parenchymal hemorrhage. No mass lesion. Small to moderate volume subarachnoid hemorrhage of the right frontotemporal, left temporal, left frontal, bilateral occipital (left greater than right lobes). Also trace subarachnoid hemorrhage along the left cerebellum. No definite evidence of a subdural or epidural hematoma. No mass effect or midline shift. No hydrocephalus. Basilar cisterns are patent. Vascular: No hyperdense vessel. Atherosclerotic calcifications are present within the cavernous internal carotid arteries. Skull: No acute fracture or focal lesion. Sinuses/Orbits: Mucosal thickening of the left maxillary sinus. Otherwise the remaining visualized paranasal sinuses and mastoid air cells are clear. Bilateral lens replacement. Otherwise the orbits are unremarkable. Other: Right frontoparietal scalp soft tissue edema and trace subgaleal hematoma. CT CERVICAL SPINE FINDINGS Alignment: Normal. Skull base and vertebrae: No acute fracture. No aggressive appearing focal osseous lesion or focal pathologic process. Soft tissues and spinal canal: No prevertebral fluid or swelling. No visible canal hematoma. Upper chest: Unremarkable. Other: Approximately 3.7 cm hypodense right thyroid gland nodule. IMPRESSION: 1. Moderate volume scattered cerebral subarachnoid hemorrhage as well as trace subarachnoid left cerebellar hemorrhage. 2. No acute displaced fracture or traumatic listhesis of the cervical spine. 3. Approximately 3.7 cm hypodense right thyroid gland nodule. Recommend thyroid US (ref: J Am Coll Radiol. 2015 Feb;12(2): 143-50). However, please note that in the setting of significant comorbidities or limited life expectancy, no follow-up recommended (ref: J Am Coll Radiol. 2015 Feb;12(2): 143-50). These results were called by telephone at the time of interpretation on 03/18/2020 at 6:53 pm to provider PA [facility] [place] , who verbally acknowledged these  results. Electronically Signed   By: Iven Finn M.D.   On: 03/18/2020 18:57   DG Pelvis Portable  Result Date: 03/18/2020 CLINICAL DATA:  Trauma, fall on blood thinners. EXAM: PORTABLE PELVIS 1-2 VIEWS COMPARISON:  None. FINDINGS: The bones are diffusely under mineralized. The cortical margins of the bony pelvis are intact. No fracture. Pubic symphysis and sacroiliac joints are congruent. Both femoral heads are well-seated in the respective acetabula. IMPRESSION: No pelvic fracture. Electronically Signed   By:  Keith Rake M.D.   On: 03/18/2020 18:34   DG Chest Portable 1 View  Result Date: 03/18/2020 CLINICAL DATA:  Trauma, fall on blood thinners. EXAM: PORTABLE CHEST 1 VIEW COMPARISON:  None. FINDINGS: Rotated exam. Median sternotomy and prosthetic cardiac valve. Borderline cardiomegaly. Aortic atherosclerosis. No pneumothorax or large pleural effusion. Calcified granuloma at the left lung base. No acute osseous abnormalities are seen. IMPRESSION: Rotated exam without evidence of acute traumatic injury. Electronically Signed   By: Keith Rake M.D.   On: 03/18/2020 18:33    Anti-infectives: Anti-infectives (From admission, onward)   None       Assessment/Plan Hx CABG CKD CHF DM HTN Gout RUL pulmonary nodule - incidentally seen on CT Hypodensity right thyroid gland - incidentally seen on CT  --per TRH--  Fall Abdominal wall hematoma - abdominal binder, monitor CBC, alternate ice and heat ABL anemia - Hgb 10.6 from 12.2, no hypotension or tachycardia, repeat CBC in AM SAH - per NSGY (Dr. Reatha Armour), hold ASA x2 weeks, Keppra '500mg'$  BID x7 days, q2 neuro check, SBP control, syncopal work up per primary, repeat CT head this AM Right forehead/scalp laceration - repaired with prolene suture by EDPA 3/8  ID - none FEN - reg diet VTE - SCDs only for now due to [facility] [place] - none  Plan - TBI team therapies. Monitor CBC as above.    LOS: 1 day    Wellington Hampshire,  [facility] 03/19/2020, 9:43 AM Please see Amion for pager number during day hours 7:00am-4:30pm

## 2020-03-19 NOTE — ED Notes (Signed)
Talked with daughter by phone

## 2020-03-19 NOTE — ED Notes (Signed)
Patient verbalized understanding of discharge instructions. Opportunity for questions and answers.  

## 2020-03-19 NOTE — Progress Notes (Signed)
  Echocardiogram 2D Echocardiogram has been performed.  Jennette Dubin 03/19/2020, 11:02 AM

## 2020-03-19 NOTE — Progress Notes (Signed)
   Providing Compassionate, Quality Care - Together  NEUROSURGERY PROGRESS NOTE   S: No issues overnight.  States her headache feels better.  O: EXAM:  BP (!) 144/59   Pulse 63   Temp 98.4 F (36.9 C) (Oral)   Resp (!) 23   Ht 5' 0.5" (1.537 m)   Wt 63.5 kg   SpO2 97%   BMI 26.89 kg/m   Awake, alert, oriented x3 PERRLA EOMI Right scalp black, status post repair Speech fluent, appropriate  CNs grossly intact  5/5 BUE/BLE   ASSESSMENT:  85 y.o. female with  1.  Traumatic subarachnoid hemorrhage/contusions due to fall/syncopal episode  PLAN: -Repeat CT brain stable -No acute neurosurgical intervention -Hold aspirin x2 weeks -Keppra x7 days, no further episodes of twitching -Follow-up as needed as an outpatient -We will sign off at this time    Thank you for allowing me to participate in this patient's care.  Please do not hesitate to call with questions or concerns.   Elwin Sleight, [facility] Cell: [phone]

## 2020-03-19 NOTE — Evaluation (Signed)
Physical Therapy Evaluation Patient Details Name: [name] MRN: [id number] DOB: 1935/04/18 Today's Date: 03/19/2020   History of Present Illness  85 yo female with onset of fall in grocery store w suspected syncope was brought to ED and reports she was unable to remember the event.  Has abd hematoma, Subarachnoid hemorrhage in R frontotemporal , L temporal, L frontal, B occipital and L cerebellar lobes.  Had partial seizure in ED, LLE hip and lower leg contusion.  PMHx:  CABG, cholecystectomy, DM, CKD, CHF, gout, valve replacement,  Clinical Impression  Pt was seen for initial PT evaluation with a good control of sitting balance relatively and able to walk with minimal help of HHA.  Her plan is to explore the need for RW vs SPC, then verify as well that family can assist as she claims, to be at home with her initially.  If this is not possible will need to consider rehab stay to get a bit more controlled balance and gait, and to allow her to recover independence.  Follow her for these acute goals, and work toward dc to HHPT with care not to forget her limited help without verification.    Follow Up Recommendations Home health PT;Supervision/Assistance - 24 hour    Equipment Recommendations  Rolling walker with 5" wheels (will need to practice with this)    Recommendations for Other Services       Precautions / Restrictions Precautions Precautions: Fall;Other (comment) (seizure) Precaution Comments: contusions on LLE Restrictions Weight Bearing Restrictions: No      Mobility  Bed Mobility Overal bed mobility: Needs Assistance Bed Mobility: Supine to Sit;Sit to Supine     Supine to sit: Min assist Sit to supine: Min assist   General bed mobility comments: min assist to manage with LE pain and stiffness from fall    Transfers Overall transfer level: Needs assistance Equipment used: 1 person hand held assist Transfers: Sit to/from Stand Sit to Stand: Min assist;Min guard          General transfer comment: min assist first trial and then min guard for safety  Ambulation/Gait Ambulation/Gait assistance: Min guard Gait Distance (Feet): 15 Feet Assistive device: 1 person hand held assist Gait Pattern/deviations: Step-through pattern;Decreased stride length;Narrow base of support Gait velocity: reduced Gait velocity interpretation: <1.31 ft/sec, indicative of household ambulator General Gait Details: walking around the bed with care due to discomfort from fall and her need for minor balance support  Stairs            Wheelchair Mobility    Modified Rankin (Stroke Patients Only) Modified Rankin (Stroke Patients Only) Pre-Morbid Rankin Score: No symptoms Modified Rankin: Moderately severe disability     Balance Overall balance assessment: Needs assistance;History of Falls Sitting-balance support: Feet supported Sitting balance-Leahy Scale: Fair   Postural control: Right lateral lean Standing balance support: Bilateral upper extremity supported Standing balance-Leahy Scale: Fair Standing balance comment: less than fair dynamically                             Pertinent Vitals/Pain Pain Assessment: Faces Faces Pain Scale: Hurts even more Pain Location: head and LLE Pain Descriptors / Indicators: Grimacing;Aching Pain Intervention(s): Limited activity within patient's tolerance;Monitored during session;Repositioned    Home Living Family/patient expects to be discharged to:: Private residence Living Arrangements: Alone Available Help at Discharge: Family;Friend(s);Available PRN/intermittently;Available 24 hours/day Type of Home: House Home Access: Level entry     Home Layout: Two  level;Able to live on main level with bedroom/bathroom Home Equipment: None Additional Comments: drove herself to the store the day of the stroke    Prior Function Level of Independence: Independent         Comments: self care and household care  with full I     Hand Dominance   Dominant Hand: Right    Extremity/Trunk Assessment   Upper Extremity Assessment Upper Extremity Assessment: Overall WFL for tasks assessed    Lower Extremity Assessment Lower Extremity Assessment: Generalized weakness    Cervical / Trunk Assessment Cervical / Trunk Assessment: Kyphotic (mild)  Communication   Communication: No difficulties  Cognition Arousal/Alertness: Awake/alert Behavior During Therapy: WFL for tasks assessed/performed Overall Cognitive Status: Within Functional Limits for tasks assessed                                 General Comments: able to give PT her history      General Comments General comments (skin integrity, edema, etc.): pt is up to walk in the room with minor assist and may benefit from [facility] vs walker next visit    Exercises     Assessment/Plan    PT Assessment Patient needs continued PT services  PT Problem List Decreased strength       PT Treatment Interventions DME instruction;Gait training;Stair training;Functional mobility training;Therapeutic activities;Therapeutic exercise;Balance training;Neuromuscular re-education;Patient/family education    PT Goals (Current goals can be found in the Care Plan section)  Acute Rehab PT Goals Patient Stated Goal: to get home and feel better PT Goal Formulation: With patient Time For Goal Achievement: 04/02/20 Potential to Achieve Goals: Good    Frequency Min 3X/week   Barriers to discharge Decreased caregiver support has family that will need to agree to help    Co-evaluation               AM-PAC PT "6 Clicks" Mobility  Outcome Measure Help needed turning from your back to your side while in a flat bed without using bedrails?: A Little Help needed moving from lying on your back to sitting on the side of a flat bed without using bedrails?: A Little Help needed moving to and from a bed to a chair (including a wheelchair)?: A  Little Help needed standing up from a chair using your arms (e.g., wheelchair or bedside chair)?: A Little Help needed to walk in hospital room?: A Little Help needed climbing 3-5 steps with a railing? : A Little 6 Click Score: 18    End of Session   Activity Tolerance: Patient tolerated treatment well;Patient limited by pain Patient left: in bed;with call bell/phone within reach Nurse Communication: Mobility status PT Visit Diagnosis: Unsteadiness on feet (R26.81);Muscle weakness (generalized) (M62.81)    Time: [id number] PT Time Calculation (min) (ACUTE ONLY): 30 min   Charges:   PT Evaluation $PT Eval Moderate Complexity: 1 Mod PT Treatments $Gait Training: 8-22 mins       Ramond Dial 03/19/2020, 1:54 PM  Mee Hives, PT MS Acute Rehab Dept. Number: [place] and [place]

## 2020-03-19 NOTE — Discharge Instructions (Signed)
You were hospitalized at [facility] due to a fall.  You were found to have a small bleed in your head and also sustained a scalp laceration that was sutured. We contacted the Neurosurgeons who recommended repeat imaging of your head. The repeat imaging showed that your bleeding was improving. Because of the bleed, we held your aspirin. Please do NOT take Aspirin for 2 weeks. After two weeks you can restart. The Neurosurgery team would also like you to take [place], which is an anti-seizure medication, for 1 week. The medication was sent to your room prior to discharge. If your headache worsens please be seen by your doctor of the Emergency Department ASAP.  We are so glad you are feeling better.  Be sure to follow-up with your Primary Care Physician. Thank you for allowing [place] to take care of you and happy early birthday!  Take care, [facility] family medicine team   Subarachnoid Hemorrhage Subarachnoid hemorrhage is bleeding around the brain. The bleeding puts pressure on the brain, and it stops blood from going to some areas of the brain. If this bleeding is not treated, it may cause brain damage or death. This is an emergency. You must be treated in the hospital right away. What are the causes?  Having a weak blood vessel in the brain that bursts.  Getting a head injury.  Bleeding from blood vessels that have developed abnormally.  Having a bleeding disorder.  Using blood-thinning medicines (anticoagulants).  Using certain drugs, such as cocaine. In some cases, the cause is not known.   What increases the risk?  Smoking.  Having high blood pressure.  Drinking too much alcohol.  Having a family history of brain aneurysm.  Being older than 83 years old.  Being female, especially if you no longer have periods.  Having a certain syndrome.  Using cocaine. What are the signs or symptoms?  A sudden, very bad headache. It may feel like the worst headache you have ever  had.  Feeling like you may vomit (nausea) or vomiting, especially if you have other signs such as a headache.  Changes in your mental status such as: ? Fainting. ? Sudden confusion. ? Trouble staying awake.  Stiff neck.  Changes in your vision such as: ? Being sensitive to light. ? Trouble seeing out of one eye or both eyes.  Sudden weakness or loss of feeling in your face, arm, or leg, especially on one side of the body.  Sudden trouble with any of these: ? Walking or moving an arm or leg. ? Talking. ? Understanding what people say. ? Swallowing. ? Balance. How is this treated? You need to be treated in the hospital. Treatment may include:  Medicines that: ? Reverse the effects of blood thinners, if you were taking blood thinners. ? Lower your blood pressure. ? Relieve pain. ? Relieve vomiting or the feeling like you may vomit. ? Prevent seizures.  Surgery to stop bleeding, repair the cause of the bleeding, or remove blood that has collected. This may include: ? A procedure that is done from inside the blood vessel, in which the aneurysm is filled with small, platinum coils (endovascular coiling). ? Opening the skull (craniotomy) to reach the aneurysm and put a clip at the base of the aneurysm (surgical clipping).  Surgery to relieve pressure on the brain by placing a tube in the brain to drain blood.  Physical, occupational, or speech-language therapy . Other treatment depends on the cause and the symptoms, how  long the symptoms have lasted, and how bad they are. Follow these instructions at home: Medicines  Take over-the-counter and prescription medicines only as told by your doctor.  Ask your doctor if the medicine prescribed to you requires you to avoid driving or using machinery.  Do not take any medicines that contain aspirin or NSAIDs, such as ibuprofen, unless your doctor says that it is safe to take them. Lifestyle  Rest and limit activity as told by your  doctor. Rest helps your brain to heal. Make sure you: ? Get plenty of sleep. ? Avoid activities that cause stress to your body or mind.  Do not use any products that have nicotine or tobacco. These include cigarettes, e-cigarettes, and chewing tobacco. If you need help quitting, ask your doctor. General instructions  Do therapy as recommended. This may include: ? Physical therapy. ? Occupational therapy. ? Speech-language therapy.  Ask your doctor if it is safe for you to eat and drink. You may need tests to make sure that you can swallow safely.  Check your blood pressure as told by your doctor. Write down your blood pressure.  Do not drive until your doctor says that it is safe to drive.  Keep all follow-up visits as told by your doctor. This is important. Where to find more information  American Stroke Association: www.stroke.org Contact a doctor if:  You have a stiff neck.  You have a cough.  You have a fever. Get help right away if:  You have any signs of a stroke. "BE FAST" is an easy way to remember the main warning signs: ? B - Balance. Signs are dizziness, sudden trouble walking, or loss of balance. ? E - Eyes. Signs are trouble seeing or a sudden change in how you see. ? F - Face. Signs are sudden weakness or loss of feeling of the face, or the face or eyelid drooping on one side. ? A - Arms. Signs are weakness or loss of feeling in an arm. This happens suddenly and usually on one side of the body. ? S - Speech. Signs are sudden trouble speaking, slurred speech, or trouble understanding what people say. ? T - Time. Time to call emergency services. Write down what time symptoms started.  You have other signs of a stroke, such as: ? A sudden, very bad headache with no known cause. ? Feeling like you may vomit. ? Vomiting. ? Seizure. These symptoms may be an emergency. Do not wait to see if the symptoms will go away. Get medical help right away. Call your local  emergency services (911 in the [place] Do not drive yourself to the hospital. Summary  Subarachnoid hemorrhage is bleeding in the brain. It is an emergency. You must be treated in the hospital right away.  Follow instructions from your doctor about eating, resting, and taking medicines.  Do not take any medicines that contain aspirin or NSAIDs unless your doctor says that it is safe to take them. This information is not intended to replace advice given to you by your health care provider. Make sure you discuss any questions you have with your health care provider. Document Revised: 02/13/2019 Document Reviewed: 02/13/2019 Elsevier Patient Education  2021 [employer].

## 2020-03-19 NOTE — Care Management CC44 (Signed)
Condition Code 44 Documentation Completed  Patient Details  Name: [name] MRN: [id number] Date of Birth: 10-Dec-1935   Condition Code 44 given:  Yes Patient signature on Condition Code 44 notice:  Yes Documentation of 2 MD's agreement:  Yes Code 44 added to claim:  Yes    Chaynce Schafer, LCSW 03/19/2020, 7:03 PM

## 2020-03-19 NOTE — ED Notes (Signed)
PT remains at bedside. 

## 2020-03-19 NOTE — Progress Notes (Signed)
Carotid completed   Please see CV Proc for preliminary results.   Margean Korell, RVT  

## 2020-03-19 NOTE — Progress Notes (Addendum)
Family Medicine Teaching Service Daily Progress Note Intern Pager: [phone]  Patient name: [name] record number: [id number] Date of birth: [birth year] Age: 85 y.o. Gender: female  Primary Care Provider: Verdell Sanchez., MD Consultants: Neurosurgery Code Status: FULL  Pt Overview and Major Events to Date:  Admitted 3/8: Multiple small traumatic SAH without mass effect  Assessment and Plan: [name] is a 85 y.o. female who was admittted following syncopal event with fall and found to have multiple traumatic SAH on CT scan. PMH is significant for CABG, cholecystectomy, T2DM, CKD, CHF, gout  Multiple traumatic subarachnoid hemorrhages  Syncope  Fall Vital signs overall stable at patient with hypertension up to the [id number] systolic.  Most recent BP 144/59. Reassuringly, repeat CT this AM demonstrates slight decrease in post-traumatic SAH visualized yesterday. No new intracranial abnormalities. BNP elevated to 332.7, unknown baseline.  TSH WNL. WBC slightly increased to 14.4, likely in the setting of acute stress event.  -Repeat head CT this morning -Continue to hold aspirin -Neuro checks every 2 hours -BP goal less than [id number] systolic -Continue Keppra 500 mg twice daily x7 days; switch to oral -F/u labs: UA -F/u echocardiogram and carotid Doppler -Follow-up PT/OT recommendations -IVF at 118m/hr  ?AKI on CKD stage IIIb Creatinine 1.60> 1.48.  GFR 32> 35.  -IVF at maintenance rate  Normocytic anemia Hemoglobin 10.6, MCV 90.7.  Anemia likely in the setting of chronic disease with CHF and CKD. -F/u CBC outpatient  CHF BNP elevated to 332.7. Home medications include spironolactone 25 mg twice daily, torsemide 20 mg twice daily, metoprolol succinate 50 mg daily. -Follow-up echo -Strict I's and O's -Daily weights  DM, diet controlled: Chronic, stable Patient follows a very strict ketogenic diet with a goal of 18 carbs per day.  Random glucose 145 on admission.  Hemoglobin A1c  is 6.3. She is currently not taking any diabetic medications.   -Discontinue CBGs -Discontinue sSSI  HTN chronic, stable Most recent BP 136/56. Recommendation per neurosurgery to maintain systolics <123456 Home medications include losartan '25mg'$  daily, metoprolol succinate '50mg'$  daily. -Continue home losartan and metoprolol -Goal systolic BP <123456  Gout; chronic, stable Home medications include allopurinol -Continue home allopurinoll  History of CABG: Chronic, stable -Hold aspirin x2 weeks  Incidental findings Findings discovered on imaging includes a 7 mm right upper pulmonary nodule and a 3.6 cm hypodensity of the right thyroid gland. -Per radiology: non-contrast chest CT at 6-12 months is recommended for pulmonary nodule.  If stable, then future CT at 18-24 months (from 03/18/20) is considered optional for low-risk patients, but is recommended for high-risk patients. -Per radiology: For hypodensity in the right thyroid gland, ultrasound is recommended and less in the setting of significant comorbidities or limited life expectancy.   FEN/GI: Regular diet PPx: SCDs   Status is: Inpatient  Remains inpatient appropriate because:Ongoing diagnostic testing needed not appropriate for outpatient work up   Dispo: The patient is from: Home              Anticipated d/c is to: Home vs SNF              Patient currently is not medically stable to d/c.   Difficult to place patient No  Subjective:   Patient complains of headache this morning. She does not recall the fall yesterday. She last remembers going to the grocery store and picking out strawberries.  She came to in the ambulance. She is unsure if anyone witnessed this fall. Prior to her fall  she was in usual health.  She denies history of similar symptoms in the past.  She denies history of seizures and has never seen a neurologist.  She does note prior history of urinary and fecal incontinence.  No recent addition of  medications.  Objective: Temp:  [98.4 F (36.9 C)] 98.4 F (36.9 C) (03/08 1819) Pulse Rate:  [57-66] 63 (03/08 2327) Resp:  [12-23] 14 (03/08 2327) BP: (136-187)/(56-72) 136/56 (03/08 2327) SpO2:  [93 %-100 %] 97 % (03/08 2327) Weight:  [63.5 kg] 63.5 kg (03/08 1824) Physical Exam: General: Awake, alert, in no distress Skin: R scalp lac s/p suture repair with dried blood, right flank hematoma  Cardiovascular: Systolic murmur, regular rate Respiratory: CTAB without wheezing/rhonchi/rales Abdomen: soft, non-distended, non-tender in all quadrants, no rebound/guarding Extremities: Trace pitting edema b/l lower extremities Neuro: AxO x4, CN 2-12 intact, follows commands  Laboratory: Recent Labs  Lab 03/18/20 1759 03/18/20 1819 03/19/20 0422  WBC 9.4  --  14.4*  HGB 11.8* 12.2 10.6*  HCT 38.8 36.0 33.1*  PLT 268  --  269   Recent Labs  Lab 03/18/20 1759 03/18/20 1819 03/19/20 0422  NA 138 139 137  K 4.6 4.5 4.6  CL 104 104 103  CO2 22  --  21*  BUN 51* 58* 51*  CREATININE 1.58* 1.60* 1.48*  CALCIUM 9.7  --  9.3  PROT 7.1  --   --   BILITOT 0.6  --   --   ALKPHOS 50  --   --   ALT 22  --   --   AST 26  --   --   GLUCOSE 122* 121* 145*   Imaging/Diagnostic Tests: Repeat Head Ct 3/9 IMPRESSION: 1. Small volume posttraumatic subarachnoid hemorrhage has slightly decreased since yesterday. Mild redistribution, including trace IVH now. 2. No new traumatic injury to the brain identified. No new intracranial abnormality. 3. Right lateral scalp hematoma.  Sharion Settler, DO 03/19/2020, 5:41 AM PGY-1, [place] Intern pager: [phone], text pages welcome

## 2020-03-19 NOTE — Discharge Summary (Signed)
[facility] Discharge Summary  Patient name: [name] record number: [id number] Date of birth: October 07, 1935 Age: 85 y.o. Gender: female Date of Admission: 03/18/2020  Date of Discharge: 03/19/2020 Admitting Physician: Matilde Haymaker, MD  Primary Care Provider: Verdell Carmine., MD Consultants: Neurosurgery  Indication for Hospitalization: Subarachnoid hemorrhages, syncope  Discharge Diagnoses/Problem List:  Subarachnoid hemorrhage Syncope Fall CKD Stage 3b Normocytic Anemia CHF HTN Gout  Disposition: Home  Discharge Condition: Stable  Discharge Exam:  General: Awake, alert, in no distress Skin: R scalp lac s/p suture repair with dried blood, right flank hematoma  Cardiovascular: Systolic murmur, regular rate Respiratory: CTAB without wheezing/rhonchi/rales Abdomen: soft, non-distended, non-tender in all quadrants, no rebound/guarding Extremities: Trace pitting edema b/l lower extremities Neuro: AxO x4, CN 2-12 intact, follows commands  Brief Hospital Course:  [name] is a 85 y.o. female presenting with syncopal event, fall and found to have subarachnoid hemorrhages on CT scan. PMH is significant for CABG, cholecystectomy, T2DM, CKD, CHF, gout.  Subarachnoid Hemorrhages  Fall  Syncope Patient admitted with syncopal event and fall (unsure of which occurred first). CT scan was significant for multiple small traumatic SAH without mass effect and neurosurgery was consulted and recommended repeat CT imaging in AM. Reassuringly, repeat imaging demonstrated improvement in [facility]. In the ED, there was concern for possible focal seizure activity with left thigh twitching, neurosurgery recommended Keppra load with 1000 mg IV followed by oral keppra 500 mg x7 days. No recurrent concerns for seizure. Patient's home ASA was held during admission with instructions to hold them for 2 weeks following discharge. Patient was alert and oriented with no signs of  neurological deficit throughout admission. Echo obtained and showed EF 55-60%, left atrial size severely dilated, right atrial size moderately dilated, mild-mod tricuspid valve and mild aortic valve stenosis. Carotid dopplers with 1-39% stenosis of both right and left carotid arteries. Evaluated by PT who recommended home health PT and rolling walker.   All other chronic conditions remained stable.  Issues for follow-up: 1. Follow up CBC outpatient. Hgb 10.6 on d/c.  2. Follow up BMP. Patient with history of CKD, unsure of baseline creatinine. Creatinine on d/c 1.48.    Significant Procedures: None  Significant Labs and Imaging:  Recent Labs  Lab 03/18/20 1759 03/18/20 1819 03/19/20 0422  WBC 9.4  --  14.4*  HGB 11.8* 12.2 10.6*  HCT 38.8 36.0 33.1*  PLT 268  --  269   Recent Labs  Lab 03/18/20 1759 03/18/20 1819 03/19/20 0422  NA 138 139 137  K 4.6 4.5 4.6  CL 104 104 103  CO2 22  --  21*  GLUCOSE 122* 121* 145*  BUN 51* 58* 51*  CREATININE 1.58* 1.60* 1.48*  CALCIUM 9.7  --  9.3  ALKPHOS 50  --   --   AST 26  --   --   ALT 22  --   --   ALBUMIN 3.7  --   --    Echo 3/9 IMPRESSIONS  1. Left ventricular ejection fraction, by estimation, is 55 to 60%. The left ventricle has normal function. The left ventricle has no regional wall motion abnormalities. There is mild concentric left ventricular hypertrophy. Left ventricular diastolic  parameters are consistent with Grade I diastolic dysfunction (impaired relaxation).  2. Right ventricular systolic function is normal. The right ventricular  size is moderately enlarged. There is normal pulmonary artery systolic pressure. The estimated right ventricular systolic pressure is 0000000 mmHg.  3.  Left atrial size was severely dilated.  4. Right atrial size was moderately dilated.  5. The mitral valve is abnormal. No evidence of mitral valve regurgitation.  6. Tricuspid valve regurgitation is mild to moderate.  7. The aortic  valve was not well visualized. There is mild calcification of the aortic valve. Aortic valve regurgitation is not visualized. Mild to moderate aortic valve stenosis. Aortic valve area, by VTI measures 1.21 cm. Aortic valve mean gradient  measures 18.0 mmHg.   Preliminary U/S Carotid 3/9 Summary:  Right Carotid: Velocities in the right ICA are consistent with a 1-39% stenosis.  Left Carotid: Velocities in the left ICA are consistent with a 1-39%  stenosis.   Results/Tests Pending at Time of Discharge: None  Discharge Medications:  Allergies as of 03/19/2020      Reactions   Januvia [sitagliptin] Rash   Keflex [cephalexin] Rash      Medication List    TAKE these medications   allopurinol 100 MG tablet Commonly known as: ZYLOPRIM Take 200 mg by mouth daily.   cholecalciferol 25 MCG (1000 UNIT) tablet Commonly known as: VITAMIN D3 Take 1,000 Units by mouth daily.   Fish Oil 1000 MG Caps Take 1 capsule by mouth daily.   IRON PO Take 1 tablet by mouth daily.   levETIRAcetam 500 MG tablet Commonly known as: Keppra Take 1 tablet (500 mg total) by mouth 2 (two) times daily for 7 days.   losartan 25 MG tablet Commonly known as: COZAAR Take 25 mg by mouth daily.   magnesium 30 MG tablet Take 30 mg by mouth daily.   metoprolol succinate 50 MG 24 hr tablet Commonly known as: TOPROL-XL Take 50 mg by mouth daily.   Multivitamin Adults Tabs Take 1 tablet by mouth daily.   potassium chloride 10 MEQ tablet Commonly known as: KLOR-CON Take 10 mEq by mouth every evening.   pravastatin 80 MG tablet Commonly known as: PRAVACHOL Take 80 mg by mouth daily.   spironolactone 25 MG tablet Commonly known as: ALDACTONE Take 25 mg by mouth 2 (two) times daily.   torsemide 20 MG tablet Commonly known as: DEMADEX Take 20 mg by mouth 2 (two) times daily.   vitamin B-12 1000 MCG tablet Commonly known as: CYANOCOBALAMIN Take 1,000 mcg by mouth daily.            Durable  Medical Equipment  (From admission, onward)         Start     Ordered   03/19/20 1759  For home use only DME Walker rolling  Once       Question Answer Comment  Walker: With 5 Inch Wheels   Patient needs a walker to treat with the following condition Subarachnoid hemorrhage ([place])      03/19/20 1759   03/19/20 1439  For home use only DME Walker rolling  Once       Question Answer Comment  Walker: With 5 Inch Wheels   Patient needs a walker to treat with the following condition Balance problem      03/19/20 1438           Discharge Care Instructions  (From admission, onward)         Start     Ordered   03/19/20 0000  Discharge wound care:       Comments: Please wash with soap and water daily, you may use vaseline or aquafor ointment for healing. Please seek medical attention if you notice increasing warmth, spreading  redness, pustulant drainage, or if sutures rupture prior to healing. These sutures should be removed in 7-10 days.   03/19/20 1758          Discharge Instructions: Please refer to Patient Instructions section of EMR for full details.  Patient was counseled important signs and symptoms that should prompt return to medical care, changes in medications, dietary instructions, activity restrictions, and follow up appointments.   Follow-Up Appointments:  Follow-up Information    Spry, Marsh Dolly., MD. Schedule an appointment as soon as possible for a visit.   Specialty: Family Medicine Why: call to make an appointment in the next week Contact information: [address] [place] [address] [phone]               Terrel Manalo, [place], DO 03/19/2020, 6:40 PM PGY-1, [place]

## 2020-03-19 NOTE — Care Management Obs Status (Signed)
[facility] NOTIFICATION   Patient Details  Name: [name] MRN: [id number] Date of Birth: 1935/03/07   Medicare Observation Status Notification Given:  Yes    Vianny Schraeder, LCSW 03/19/2020, 7:02 PM

## 2020-03-19 NOTE — ED Notes (Signed)
Called bed placement to check on bed status. Next up for neuro bed.

## 2020-03-19 NOTE — Progress Notes (Signed)
   03/19/20 2242  TOC ED Mini Assessment  TOC Time spent with patient (minutes): 30  PING Used in TOC Assessment  (n/a)  Admission or Readmission Diverted  (n/a)  What brought you to the Emergency Department?  Patient was for admission but was discharged from the ED  Barrier interventions ED RN consulted [facility] concerning patient being discharged and now needing [facility] services and DME. ED RNCM spoke with patient and daughter Bibiana Finan [id number] to discuss recoomendation for [facility] PT and rolling walker,both patien and daughter are agreeable. RNCM explained that there are limitation due to [facility] so we will send the referal to several [facility] agencies, CM will follow up tomorrow.

## 2020-03-19 NOTE — ED Notes (Signed)
Updated pt dtr.

## 2020-03-19 NOTE — Care Management CC44 (Signed)
Condition Code 44 Documentation Completed  Patient Details  Name: Jacquelyne Cun MRN: [id number] Date of Birth: [birth year]   Condition Code 44 given:  Yes Patient signature on Condition Code 44 notice:  Yes Documentation of 2 MD's agreement:  Yes Code 44 added to claim:  Yes    Jazlynne Milliner, LCSW 03/19/2020, 7:02 PM

## 2020-03-19 NOTE — ED Notes (Signed)
Patient transported to Ultrasound 

## 2020-03-19 NOTE — ED Notes (Signed)
Warm blankets,  Talked with daughter by phone

## 2020-03-19 NOTE — Hospital Course (Addendum)
[name] is a 85 y.o. female presenting with syncopal event, fall and found to have subarachnoid hemorrhages on CT scan. PMH is significant for CABG, cholecystectomy, T2DM, CKD, CHF, gout.  Subarachnoid Hemorrhages  Fall  Syncope Patient admitted with syncopal event and fall (unsure of which occurred first). CT scan was significant for multiple small traumatic SAH without mass effect and neurosurgery was consulted and recommended repeat CT imaging in AM. Reassuringly, repeat imaging demonstrated improvement in [facility]. In the ED, there was concern for possible focal seizure activity with left thigh twitching, neurosurgery recommended Keppra load with 1000 mg IV followed by oral keppra 500 mg x7 days. No recurrent concerns for seizure. Patient's home ASA was held during admission with instructions to hold them for 2 weeks following discharge. Patient was alert and oriented with no signs of neurological deficit throughout admission. Echo obtained and showed EF 55-60%, left atrial size severely dilated, right atrial size moderately dilated, mild-mod tricuspid valve and mild aortic valve stenosis. Carotid dopplers with 1-39% stenosis of both right and left carotid arteries. Evaluated by PT who recommended home health PT and rolling walker.   All other chronic conditions remained stable.  Issues for follow-up: Follow up CBC outpatient. Hgb 10.6 on d/c.  Follow up BMP. Patient with history of CKD, unsure of baseline creatinine. Creatinine on d/c 1.48.

## 2020-03-21 ENCOUNTER — Telehealth: Payer: Self-pay | Admitting: Surgery

## 2020-03-21 DIAGNOSIS — Z9181 History of falling: Secondary | ICD-10-CM

## 2020-03-21 HISTORY — DX: History of falling: Z91.81

## 2020-03-21 NOTE — Telephone Encounter (Signed)
ED RNCM attempted to contact patient and daughter [name] by phone to provide update concerning  [facility] accepting the referral and will  be providing services to patient. Rep will contact them to set up initial assessment visit.  CM will make a second attempt later today.

## 2020-04-10 DIAGNOSIS — Z0000 Encounter for general adult medical examination without abnormal findings: Secondary | ICD-10-CM

## 2020-04-10 HISTORY — DX: Encounter for general adult medical examination without abnormal findings: Z00.00

## 2020-04-11 DIAGNOSIS — S066XAA Traumatic subarachnoid hemorrhage with loss of consciousness status unknown, initial encounter: Secondary | ICD-10-CM | POA: Insufficient documentation

## 2020-04-11 HISTORY — DX: Traumatic subarachnoid hemorrhage with loss of consciousness status unknown, initial encounter: S06.6XAA

## 2020-04-29 DIAGNOSIS — Z950 Presence of cardiac pacemaker: Secondary | ICD-10-CM | POA: Insufficient documentation

## 2020-04-29 DIAGNOSIS — I0981 Rheumatic heart failure: Secondary | ICD-10-CM | POA: Insufficient documentation

## 2020-04-29 HISTORY — DX: Presence of cardiac pacemaker: Z95.0

## 2020-04-29 HISTORY — DX: Rheumatic heart failure: I09.81

## 2020-05-19 NOTE — Progress Notes (Signed)
Cardiology Office Note:    Date:  05/20/2020   ID:  [name], DOB 1935-09-30, MRN [id number]  PCP:  Verdell Carmine., MD  Cardiologist:  Shirlee More, MD    Referring MD: Verdell Carmine., MD    ASSESSMENT:    1. H/O aortic valve replacement with tissue graft   2. Atypical atrial flutter (HCC)   3. Subarachnoid hemorrhage ([place])   4. CAD in native artery   5. Chronic diastolic heart failure (HCC)   6. Stage 3a chronic kidney disease ([place])    PLAN:    In order of problems listed above:  1. I am at a loss to reconcile the 2 echocardiograms separated by 3 weeks with markedly different findings I cannot review the one that was done at [facility] clinically does not appear to have severe prosthetic valve dysfunction and going to repeat an echocardiogram in the [facility] system that I can personally review.  If she has evidence of rapid dysfunction of her prosthetic valve anticoagulation would become a more significant issue to address and otherwise I would not electively anticoagulate her at this point in time.  Will restart the beta-blocker that was discontinued in hospital. 2. Waiting evaluation by neurology [facility] 3. Stable CAD no evidence of acute coronary syndrome continue medical therapy including low-dose aspirin statin diuretic antihypertensive BP is well controlled stable CKD   Next appointment: 6 to 8 weeks   Medication Adjustments/Labs and Tests Ordered: Current medicines are reviewed at length with the patient today.  Concerns regarding medicines are outlined above.  No orders of the defined types were placed in this encounter.  No orders of the defined types were placed in this encounter.   Chief Complaint  Patient presents with  . Hospitalization Follow-up  . Follow-up    Recent traumatic subdural hematoma managed conservatively, during that timeframe she had second-degree AV block bradycardia and permanent pacemaker was inserted.  I was called by  the device practitioner [facility] to tell me she was having atypical atrial flutter/atrial tachycardia and prompted me that she may require reinstitution of anticoagulant.     History of Present Illness:    [name] is a 85 y.o. female with a hx of bioprosthetic aortic valve replacement CAD heart failure hypertension with CKD and frequent PVCs she had CABG and aortic valve replacement March 2011 with a #21 [facility] Ease bovine AVR.  She was last seen 09/27/2019.  Compliance with diet, lifestyle and medications: Yes  She is a little frustrated by multiple hospitalizations but pleased that finally bradycardia was recognized and she feels better since the pacemaker.  Unfortunately her memory is still clouded she is a little uncertain about herself and a little unsteady and is awaiting evaluation by neurology.  I went back reviewed her records spoke to the patient she was not anticoagulated before this episode.  At this point time I would be extremely reluctant to electively initiate anticoagulation unfortunately she does not know the name of her neurologist and I asked them to direct look at my office note.  She is having no palpitations syncope chest pain shortness of breath.  She is unaware of the abnormalities on echocardiogram during her recent hospitalization [facility] clinically I do not think she has prosthetic valve dysfunction.  [place]keto reviewed she had been admitted to the hospital after a fall trauma seizure and subarachnoid hemorrhage without the need for surgical intervention.  Follow-up CT scan  showed residual trace amount of hemorrhage.  She is on Keppra for seizure disorder and had bradycardia with second-degree AV block and permanent pacemaker was inserted.  She was seen EP and device follow-up 04/22/2020 with a note that she was having ongoing atrial tachycardia and flutter.  At that time she was not anticoagulated.  Her device was interrogated  that day with normal function.  Resuming anticoagulation was not interested in the discharge summary.  Echocardiogram was performed 04/11/2019 and there is notation of a gradient across her valve was 41 mmHg with significant worsening since the previous echocardiogram in March 2022 relates that in March 2021 showed normal prosthetic valve function on echocardiogram.  03/19/2020: Aortic Valve: The aortic valve was not well visualized. There is mild  calcification of the aortic valve. There is mild aortic valve annular  calcification. Aortic valve regurgitation is not visualized. Mild to  moderate aortic stenosis is present. Aortic  valve mean gradient measures 18.0 mmHg. Aortic valve peak gradient  measures 32.5 mmHg. Aortic valve area, by VTI measures 1.21 cm.   04/10/2020: SUMMARY  The left ventricle is hyperdynamic.  LV ejection fraction = 65-70%.  Probable moderate to severe concentric left ventricular hypertrophy.  There is a bioprosthetic aortic valve.  Aortic valve mean pressure gradient is 41 mmHg c/w prosthetic valve  stenosis  There is mild to moderate mitral stenosis.  There is mild mitral regurgitation.  There is mild tricuspid regurgitation.  There is no pericardialeffusion.   Recent labs 04/30/2020 creatinine 1.13 potassium 4.4 GFR 48 cc hemoglobin 10.4 MCV normal 87 Past Medical History:  Diagnosis Date  . Angina pectoris ([place]) 12/30/2014  . Beat, premature ventricular 12/30/2014  . CAD in native artery 12/30/2014  . Cardiorenal disease 12/30/2014  . Chronic diastolic heart failure ([place]) 12/30/2014  . CKD (chronic kidney disease) stage 3, GFR 30-59 ml/min (HCC) 12/30/2014  . Diabetes ([place])   . Dyslipidemia 03/20/2018  . H/O aortic valve replacement with tissue graft 12/30/2014   Overview:  S/P AVR with tissue valve, March 2011, #21 [facility] Ease bovine and CABG  . High blood pressure   . High cholesterol   . Incarcerated incisional hernia 05/14/2015  . Type 2  diabetes mellitus with peripheral angiopathy ([place]) 04/24/2015    Past Surgical History:  Procedure Laterality Date  . ABDOMINAL HERNIA REPAIR  2017  . CARDIAC VALVE REPLACEMENT    . CATARACT EXTRACTION W/ INTRAOCULAR LENS IMPLANT    . CHOLECYSTECTOMY      Current Medications: Current Meds  Medication Sig  . allopurinol (ZYLOPRIM) 100 MG tablet Take 100 mg by mouth 2 (two) times daily.  [name] aspirin EC 81 MG tablet Take 81 mg by mouth daily.  . cholecalciferol (VITAMIN D3) 25 MCG (1000 UNIT) tablet Take 1,000 Units by mouth daily.  [name] EPIPEN 2-PAK 0.3 MG/0.3ML SOAJ injection INJECT INTRAMUSCULARLY AS DIRECTED  . ferrous sulfate 325 (65 FE) MG tablet Take 325 mg by mouth daily.   [name] losartan (COZAAR) 25 MG tablet Take 25 mg by mouth daily.  . magnesium oxide (MAG-OX) 400 MG tablet Take 400 mg by mouth daily.  . Multiple Vitamins-Minerals (MULTIVITAMIN ADULTS) TABS Take 1 tablet by mouth daily.  . nitroGLYCERIN (NITROSTAT) 0.4 MG SL tablet Place 0.4 mg under the tongue as needed.  . Omega-3 1000 MG CAPS Take 1 tablet by mouth 2 (two) times daily.  . potassium chloride (KLOR-CON) 10 MEQ tablet Take 10 mEq by mouth every evening.  . pravastatin (PRAVACHOL) 80  MG tablet Take 80 mg by mouth daily.  [name] spironolactone (ALDACTONE) 25 MG tablet Take 25 mg by mouth daily.   [name] tobramycin (TOBREX) 0.3 % ophthalmic solution Place 1 drop into both eyes as needed.  . torsemide (DEMADEX) 20 MG tablet Take 20 mg by mouth 2 (two) times daily.  . vitamin B-12 (CYANOCOBALAMIN) 1000 MCG tablet Take 1,000 mcg by mouth daily.     Allergies:   Januvia [sitagliptin], Keflex [cephalexin], Januvia [sitagliptin], Keflex [cephalexin], and Tape   Social History   Socioeconomic History  . Marital status: Widowed    Spouse name: Not on file  . Number of children: Not on file  . Years of education: Not on file  . Highest education level: Not on file  Occupational History  . Not on file  Tobacco Use  . Smoking  status: Never Smoker  . Smokeless tobacco: Never Used  Substance and Sexual Activity  . Alcohol use: Yes  . Drug use: No  . Sexual activity: Not on file  Other Topics Concern  . Not on file  Social History Narrative  . Not on file   Social Determinants of Health   Financial Resource Strain: Not on file  Food Insecurity: Not on file  Transportation Needs: Not on file  Physical Activity: Not on file  Stress: Not on file  Social Connections: Not on file     Family History: The patient's family history includes Congestive Heart Failure in her mother; Diabetes in her mother; Heart attack in her father; High blood pressure in her mother; Throat cancer in her father. ROS:   Please see the history of present illness.    All other systems reviewed and are negative.  EKGs/Labs/Other Studies Reviewed:    The following studies were reviewed today:  EKG:  EKG ordered today and personally reviewed.  The ekg ordered today demonstrates dual-chamber paced rhythm no findings of atrial fibrillation or flutter  Recent Labs: 03/18/2020: ALT 22 03/19/2020: B Natriuretic Peptide 332.7; BUN 51; Creatinine, Ser 1.48; Hemoglobin 10.6; Platelets 269; Potassium 4.6; Sodium 137; TSH 1.002  Recent Lipid Panel No results found for: CHOL, TRIG, HDL, CHOLHDL, VLDL, LDLCALC, LDLDIRECT  Physical Exam:    VS:  BP 140/68 (BP Location: Left Arm, Patient Position: Sitting, Cuff Size: Normal)   Pulse 74   Ht 5' 0.5" (1.537 m)   Wt 137 lb (62.1 kg)   SpO2 99%   BMI 26.32 kg/m     Wt Readings from Last 3 Encounters:  05/20/20 137 lb (62.1 kg)  03/18/20 140 lb (63.5 kg)  09/27/19 142 lb 9.6 oz (64.7 kg)     GEN:  Well nourished, well developed in no acute distress HEENT: Normal NECK: No JVD; No carotid bruits LYMPHATICS: No lymphadenopathy CARDIAC: RRR, she has a 1-2 of 6 localized aortic area midsystolic murmur from her prosthetic AVR no aortic regurgitation, rubs, gallops RESPIRATORY:  Clear to  auscultation without rales, wheezing or rhonchi  ABDOMEN: Soft, non-tender, non-distended MUSCULOSKELETAL:  No edema; No deformity  SKIN: Warm and dry NEUROLOGIC:  Alert and oriented x 3 PSYCHIATRIC:  Normal affect    Signed, Shirlee More, MD  05/20/2020 8:47 AM    [facility]

## 2020-05-20 ENCOUNTER — Other Ambulatory Visit: Payer: Self-pay

## 2020-05-20 ENCOUNTER — Encounter: Payer: Self-pay | Admitting: Cardiology

## 2020-05-20 ENCOUNTER — Ambulatory Visit: Payer: Medicare Other | Admitting: Cardiology

## 2020-05-20 VITALS — BP 140/68 | HR 74 | Ht 60.5 in | Wt 137.0 lb

## 2020-05-20 DIAGNOSIS — I5032 Chronic diastolic (congestive) heart failure: Secondary | ICD-10-CM | POA: Diagnosis not present

## 2020-05-20 DIAGNOSIS — I484 Atypical atrial flutter: Secondary | ICD-10-CM | POA: Diagnosis not present

## 2020-05-20 DIAGNOSIS — N1831 Chronic kidney disease, stage 3a: Secondary | ICD-10-CM

## 2020-05-20 DIAGNOSIS — I609 Nontraumatic subarachnoid hemorrhage, unspecified: Secondary | ICD-10-CM | POA: Diagnosis not present

## 2020-05-20 DIAGNOSIS — Z954 Presence of other heart-valve replacement: Secondary | ICD-10-CM

## 2020-05-20 DIAGNOSIS — I2510 Atherosclerotic heart disease of native coronary artery without angina pectoris: Secondary | ICD-10-CM | POA: Diagnosis not present

## 2020-05-20 MED ORDER — METOPROLOL SUCCINATE ER 50 MG PO TB24: 50.0000 mg | ORAL_TABLET | Freq: Every day | ORAL | 3 refills | Status: DC

## 2020-05-20 NOTE — Patient Instructions (Signed)
Medication Instructions:  Your physician recommends that you continue on your current medications as directed. Please refer to the Current Medication list given to you today.  *If you need a refill on your cardiac medications before your next appointment, please call your pharmacy*   Lab Work: None If you have labs (blood work) drawn today and your tests are completely normal, you will receive your results only by: [name] MyChart Message (if you have MyChart) OR . A paper copy in the mail If you have any lab test that is abnormal or we need to change your treatment, we will call you to review the results.   Testing/Procedures: Your physician has requested that you have an echocardiogram. Echocardiography is a painless test that uses sound waves to create images of your heart. It provides your doctor with information about the size and shape of your heart and how well your heart's chambers and valves are working. This procedure takes approximately one hour. There are no restrictions for this procedure.     Follow-Up: At [facility], you and your health needs are our priority.  As part of our continuing mission to provide you with exceptional heart care, we have created designated Provider Care Teams.  These Care Teams include your primary Cardiologist (physician) and Advanced Practice Providers (APPs -  Physician Assistants and Nurse Practitioners) who all work together to provide you with the care you need, when you need it.  We recommend signing up for the patient portal called "MyChart".  Sign up information is provided on this After Visit Summary.  MyChart is used to connect with patients for Virtual Visits (Telemedicine).  Patients are able to view lab/test results, encounter notes, upcoming appointments, etc.  Non-urgent messages can be sent to your provider as well.   To learn more about what you can do with MyChart, go to [email or web address].    Your next appointment:   8  week(s)  The format for your next appointment:   In Person  Provider:   Shirlee More, MD   Other Instructions

## 2020-06-11 DIAGNOSIS — R569 Unspecified convulsions: Secondary | ICD-10-CM

## 2020-06-11 HISTORY — DX: Unspecified convulsions: R56.9

## 2020-06-13 ENCOUNTER — Other Ambulatory Visit: Payer: Self-pay

## 2020-06-13 ENCOUNTER — Ambulatory Visit (INDEPENDENT_AMBULATORY_CARE_PROVIDER_SITE_OTHER): Payer: Medicare Other

## 2020-06-13 DIAGNOSIS — Z954 Presence of other heart-valve replacement: Secondary | ICD-10-CM

## 2020-06-13 LAB — ECHOCARDIOGRAM COMPLETE
AR max vel: 1.11 cm2
AV Area VTI: 1.18 cm2
AV Area mean vel: 1.16 cm2
AV Mean grad: 17 mmHg
AV Peak grad: 30.9 mmHg
Ao pk vel: 2.78 m/s
Area-P 1/2: 3.11 cm2
S' Lateral: 2.2 cm

## 2020-06-13 NOTE — Progress Notes (Signed)
Complete echocardiogram performed.  Jimmy Selisa Tensley RDCS, RVT  

## 2020-06-16 ENCOUNTER — Telehealth: Payer: Self-pay

## 2020-06-16 NOTE — Telephone Encounter (Signed)
Spoke with patient regarding results and recommendation.  Patient verbalizes understanding and is agreeable to plan of care. Advised patient to call back with any issues or concerns.  

## 2020-06-16 NOTE — Telephone Encounter (Signed)
-----   Message from Richardo Priest, MD sent at 06/13/2020  1:10 PM EDT ----- This is a good result  Her previous echo [facility] showed dysfunction of her aortic valve  It is normal today.

## 2020-07-17 NOTE — Progress Notes (Signed)
Cardiology Office Note:    Date:  07/18/2020   ID:  [name], DOB 08-06-1935, MRN [id number]  PCP:  Verdell Carmine., MD  Cardiologist:  Shirlee More, MD    Referring MD: Verdell Carmine., MD    ASSESSMENT:    1. H/O aortic valve replacement with tissue graft   2. CAD in native artery   3. Hx of CABG   4. Hypertensive heart and chronic kidney disease with chronic diastolic congestive heart failure, unspecified CKD stage ([place])   5. Atypical atrial flutter (HCC)   6. Subarachnoid hemorrhage (HCC)    PLAN:    In order of problems listed above:  Stable there is no evidence of prosthetic valve dysfunction I cannot reconcile the last echocardiogram done at [facility] but she has had a good long-term durable result from bypass surgery and aortic valve replacement New York Heart Association class I having no shortness of breath syncope or chest pain we will continue low-dose aspirin for antithrombotic therapy antihypertensive beta-blocker diuretics and statin BP at target continue current treatment I would purposely be careful lowering her systolic blood pressure less than 1 10-1 20 Stable we will transition her pacemaker care to our practice and we can use the telemetry on her device to see if she is having significant sustained atrial arrhythmia to cause [place] to consider watchman device Avoid anticoagulants at this point in time Continue lipid-lowering therapy for hyperlipidemia   Next appointment: 6 months   Medication Adjustments/Labs and Tests Ordered: Current medicines are reviewed at length with the patient today.  Concerns regarding medicines are outlined above.  Orders Placed This Encounter  Procedures   Ambulatory referral to Cardiac Electrophysiology    No orders of the defined types were placed in this encounter.   Chief Complaint  Patient presents with   Follow-up    She had a recent echocardiogram      History of Present Illness:    [name] is a 85  y.o. female with a hx of CAD and surgical AVR March 2011 hypertension with heart failure and CKD frequent PVCs paroxysmal atrial fibrillation and permanent pacemaker and subarachnoid hemorrhage being cared for by neurology [facility].  Last seen 05/20/2020 with a question of prosthetic valve dysfunction on echocardiogram done at [facility] reviewed she had been admitted to the hospital after a fall trauma seizure and subarachnoid hemorrhage without the need for surgical intervention.  Follow-up CT scan showed residual trace amount of hemorrhage.  She is on Keppra for seizure disorder and had bradycardia with second-degree AV block and permanent pacemaker was inserted.  She was seen EP and device follow-up 04/22/2020 with a note that she was having ongoing atrial tachycardia and flutter.  At that time she was not anticoagulated.  Her device was interrogated that day with normal function.  Resuming anticoagulation was not interested in the discharge summary.  Echocardiogram was performed 04/11/2019 and there is notation of a gradient across her valve was 41 mmHg with significant worsening since the previous echocardiogram in March 2022 relates that in March 2021 showed normal prosthetic valve function on echocardiogram.   She was seen [facility] neurology 07/16/2020 with a possible seizure disorder EEG 07/02/2020 was abnormal with nonspecific findings which could indicate a structural lesion which could be electro genic decision was made not to reinstitute anticonvulsant therapy. Compliance with diet, lifestyle and medications: Yes  She is off her anticonvulsant and feels much better  for is more clear gait and balance better and at times she does not use a cane. I reviewed the results of her echocardiogram there is no prosthetic valve dysfunction. We both agree that we would be extremely reluctant to place her on anticoagulants if she has recurrent sustained  atrial arrhythmia I think a watchman device would be appropriate. Unfortunately her pacemaker is being followed in another healthcare system I cannot see the downloads she tells me she has had no communication of they did not tell her that its been received and she request to transition her pacemaker and device care to my organization. She is not having edema shortness of breath chest pain palpitation or syncope and is stated she feels much better and continues to improve from a traumatic brain injury.  Echocardiogram in my office 06/13/2020 shows normal left ventricular function EF 60 to 65% normal size and wall thickness normal diastolic filling pressure there is mild right ventricular dysfunction normal size and mild elevation of pulmonary artery pressure left atrium is mildly enlarged there is mild calcific mitral stenosis there is normal structure and function of the surgical aortic valve replacement with a peak velocity of 2.78 m/s squared within normal Doppler profile and acceleration time.  IMPRESSIONS   1. Left ventricular ejection fraction, by estimation, is 60 to 65%. The  left ventricle has normal function. The left ventricle has no regional  wall motion abnormalities. Left ventricular diastolic parameters are  consistent with Grade I diastolic  dysfunction (impaired relaxation).   2. Right ventricular systolic function is mildly reduced. The right  ventricular size is normal. There is mildly elevated pulmonary artery  systolic pressure.   3. Left atrial size was mildly dilated.   4. The mitral valve is normal in structure. Mild mitral valve  regurgitation. Mild mitral stenosis. Moderate mitral annular  calcification.   5. The aortic valve is normal in structure. Aortic valve regurgitation is  not visualized. There is a valve present in the aortic position. Procedure  Date: March 2011 with a #21 [facility] Ease bovine AVR. Echo findings  are consistent with normal  structure and  function of the aortic valve prosthesis.   6. The inferior vena cava is normal in size with greater than 50%  respiratory variability, suggesting right atrial pressure of 3 mmHg.   Her pacemaker device care is through [facility] electrophysiology device checks 07/10/2020 shows normal implant parameters and device function and although I cannot review the download there is no mention of recurrent atrial fibrillation Past Medical History:  Diagnosis Date   Angina pectoris ([place]) 12/30/2014   Beat, premature ventricular 12/30/2014   CAD in native artery 12/30/2014   Cardiorenal disease 12/30/2014   Chronic diastolic heart failure ([place]) 12/30/2014   CKD (chronic kidney disease) stage 3, GFR 30-59 ml/min ([place]) 12/30/2014   Diabetes ([place])    Dyslipidemia 03/20/2018   H/O aortic valve replacement with tissue graft 12/30/2014   Overview:  S/P AVR with tissue valve, March 2011, #21 [facility] Ease bovine and CABG   High blood pressure    High cholesterol    Incarcerated incisional hernia 05/14/2015   Type 2 diabetes mellitus with peripheral angiopathy ([place]) 04/24/2015    Past Surgical History:  Procedure Laterality Date   ABDOMINAL HERNIA REPAIR  2017   CARDIAC VALVE REPLACEMENT     CATARACT EXTRACTION W/ INTRAOCULAR LENS IMPLANT     CHOLECYSTECTOMY      Current Medications: Current Meds  Medication Sig  allopurinol (ZYLOPRIM) 100 MG tablet Take 100 mg by mouth 2 (two) times daily.   aspirin EC 81 MG tablet Take 81 mg by mouth daily.   cholecalciferol (VITAMIN D3) 25 MCG (1000 UNIT) tablet Take 1,000 Units by mouth daily.   EPIPEN 2-PAK 0.3 MG/0.3ML SOAJ injection INJECT INTRAMUSCULARLY AS DIRECTED   ferrous sulfate 325 (65 FE) MG tablet Take 325 mg by mouth daily.    losartan (COZAAR) 25 MG tablet Take 0.5 tablets by mouth daily.   magnesium oxide (MAG-OX) 400 MG tablet Take 400 mg by mouth daily.   metoprolol succinate (TOPROL-XL) 50 MG 24 hr tablet Take 1 tablet (50 mg  total) by mouth daily. Take with or immediately following a meal.   Multiple Vitamins-Minerals (MULTIVITAMIN ADULTS) TABS Take 1 tablet by mouth daily.   nitroGLYCERIN (NITROSTAT) 0.4 MG SL tablet Place 0.4 mg under the tongue as needed for chest pain.   Omega-3 1000 MG CAPS Take 1 tablet by mouth 2 (two) times daily.   potassium chloride (KLOR-CON) 10 MEQ tablet Take 10 mEq by mouth every evening.   pravastatin (PRAVACHOL) 80 MG tablet Take 80 mg by mouth daily.   spironolactone (ALDACTONE) 25 MG tablet Take 25 mg by mouth daily.    tobramycin (TOBREX) 0.3 % ophthalmic solution Place 1 drop into both eyes as needed.   torsemide (DEMADEX) 20 MG tablet Take 20 mg by mouth 2 (two) times daily.   vitamin B-12 (CYANOCOBALAMIN) 1000 MCG tablet Take 1,000 mcg by mouth daily.     Allergies:   Januvia [sitagliptin], Keflex [cephalexin], Levetiracetam, Januvia [sitagliptin], Keflex [cephalexin], and Tape   Social History   Socioeconomic History   Marital status: Widowed    Spouse name: Not on file   Number of children: Not on file   Years of education: Not on file   Highest education level: Not on file  Occupational History   Not on file  Tobacco Use   Smoking status: Never   Smokeless tobacco: Never  Substance and Sexual Activity   Alcohol use: Yes   Drug use: No   Sexual activity: Not on file  Other Topics Concern   Not on file  Social History Narrative   Not on file   Social Determinants of Health   Financial Resource Strain: Not on file  Food Insecurity: Not on file  Transportation Needs: Not on file  Physical Activity: Not on file  Stress: Not on file  Social Connections: Not on file     Family History: The patient's family history includes Congestive Heart Failure in her mother; Diabetes in her mother; Heart attack in her father; High blood pressure in her mother; Throat cancer in her father. ROS:   Please see the history of present illness.    All other systems  reviewed and are negative.  EKGs/Labs/Other Studies Reviewed:    The following studies were reviewed today:    Recent Labs: 03/18/2020: ALT 22 03/19/2020: B Natriuretic Peptide 332.7; BUN 51; Creatinine, Ser 1.48; Hemoglobin 10.6; Platelets 269; Potassium 4.6; Sodium 137; TSH 1.002  Recent Lipid Panel No results found for: CHOL, TRIG, HDL, CHOLHDL, VLDL, LDLCALC, LDLDIRECT  Physical Exam:    VS:  BP (!) 148/60 (BP Location: Right Arm, Patient Position: Sitting, Cuff Size: Normal)   Pulse 66   Ht 5' 0.5" (1.537 m)   Wt 138 lb (62.6 kg)   SpO2 98%   BMI 26.51 kg/m     Wt Readings from Last 3 Encounters:  07/18/20 138 lb (62.6 kg)  05/20/20 137 lb (62.1 kg)  03/18/20 140 lb (63.5 kg)     GEN: She appears markedly improved looks her age well nourished, well developed in no acute distress HEENT: Normal NECK: No JVD; No carotid bruits LYMPHATICS: No lymphadenopathy CARDIAC: 1 of 6 localized soft midsystolic ejection murmur no characteristics of severe prosthetic valve dysfunction RRR, no  rubs, gallops RESPIRATORY:  Clear to auscultation without rales, wheezing or rhonchi  ABDOMEN: Soft, non-tender, non-distended MUSCULOSKELETAL:  No edema; No deformity  SKIN: Warm and dry NEUROLOGIC:  Alert and oriented x 3 PSYCHIATRIC:  Normal affect    Signed, Shirlee More, MD  07/18/2020 9:32 AM    [place]

## 2020-07-18 ENCOUNTER — Other Ambulatory Visit: Payer: Self-pay

## 2020-07-18 ENCOUNTER — Encounter: Payer: Self-pay | Admitting: Cardiology

## 2020-07-18 ENCOUNTER — Ambulatory Visit: Payer: Medicare Other | Admitting: Cardiology

## 2020-07-18 VITALS — BP 148/60 | HR 66 | Ht 60.5 in | Wt 138.0 lb

## 2020-07-18 DIAGNOSIS — I2510 Atherosclerotic heart disease of native coronary artery without angina pectoris: Secondary | ICD-10-CM | POA: Diagnosis not present

## 2020-07-18 DIAGNOSIS — I609 Nontraumatic subarachnoid hemorrhage, unspecified: Secondary | ICD-10-CM

## 2020-07-18 DIAGNOSIS — Z954 Presence of other heart-valve replacement: Secondary | ICD-10-CM

## 2020-07-18 DIAGNOSIS — Z951 Presence of aortocoronary bypass graft: Secondary | ICD-10-CM | POA: Diagnosis not present

## 2020-07-18 DIAGNOSIS — I130 Hypertensive heart and chronic kidney disease with heart failure and stage 1 through stage 4 chronic kidney disease, or unspecified chronic kidney disease: Secondary | ICD-10-CM | POA: Diagnosis not present

## 2020-07-18 DIAGNOSIS — I484 Atypical atrial flutter: Secondary | ICD-10-CM

## 2020-07-18 DIAGNOSIS — I5032 Chronic diastolic (congestive) heart failure: Secondary | ICD-10-CM

## 2020-07-18 NOTE — Patient Instructions (Signed)

## 2020-08-05 ENCOUNTER — Encounter: Payer: Self-pay | Admitting: Cardiology

## 2020-08-05 ENCOUNTER — Other Ambulatory Visit: Payer: Self-pay

## 2020-08-05 ENCOUNTER — Ambulatory Visit: Payer: Medicare Other | Admitting: Cardiology

## 2020-08-05 DIAGNOSIS — I442 Atrioventricular block, complete: Secondary | ICD-10-CM | POA: Diagnosis not present

## 2020-08-05 NOTE — Progress Notes (Addendum)
Electrophysiology Office Note   Date:  08/05/2020   ID:  [name], DOB [birth year], MRN [id number]  PCP:  Verdell Carmine., MD  Cardiologist: Bettina Gavia Primary Electrophysiologist:  Azie Mcconahy Meredith Leeds, MD    Chief Complaint: Pacemaker establish care   History of Present Illness: [name] is a 85 y.o. female who is being seen today for the evaluation of pacemaker at the request of Bettina Gavia, Hilton Cork, MD. Presenting today for electrophysiology evaluation.  She has a history significant for coronary artery disease and surgical AVR March 2011, hypertension, CKD, PVCs, paroxysmal atrial fibrillation.  She has had a subarachnoid hemorrhage being followed by [facility] neurology.  Her subarachnoid hemorrhage was without the need of surgical intervention.  Follow-up CT showed a residual trace amount of hemorrhage.  She is currently on Keppra for seizure disorder.  She has third-degree AV block and is now status post Medtronic pacemaker implanted April 2020.  She had issues with subarachnoid hemorrhage after falling in the grocery store.  She had multiple hospitalizations around that time.  She had what she calls seizures and was hospitalized.  She was unresponsive at the hospital, and was found to have heart block.  She is now status post pacemaker.  Today, she denies symptoms of palpitations, chest pain, shortness of breath, orthopnea, PND, lower extremity edema, claudication, dizziness, presyncope, syncope, bleeding, or neurologic sequela. The patient is tolerating medications without difficulties.    Past Medical History:  Diagnosis Date   Angina pectoris ([place]) 12/30/2014   Beat, premature ventricular 12/30/2014   CAD in native artery 12/30/2014   Cardiorenal disease 12/30/2014   Chronic diastolic heart failure ([place]) 12/30/2014   CKD (chronic kidney disease) stage 3, GFR 30-59 ml/min ([place]) 12/30/2014   Diabetes ([place])    Dyslipidemia 03/20/2018   H/O aortic valve replacement with tissue graft  12/30/2014   Overview:  S/P AVR with tissue valve, March 2011, #21 [facility] Ease bovine and CABG   High blood pressure    High cholesterol    Incarcerated incisional hernia 05/14/2015   Type 2 diabetes mellitus with peripheral angiopathy ([place]) 04/24/2015   Past Surgical History:  Procedure Laterality Date   ABDOMINAL HERNIA REPAIR  2017   CARDIAC VALVE REPLACEMENT     CATARACT EXTRACTION W/ INTRAOCULAR LENS IMPLANT     CHOLECYSTECTOMY       Current Outpatient Medications  Medication Sig Dispense Refill   allopurinol (ZYLOPRIM) 100 MG tablet Take 100 mg by mouth 2 (two) times daily.     aspirin EC 81 MG tablet Take 81 mg by mouth daily.     cholecalciferol (VITAMIN D3) 25 MCG (1000 UNIT) tablet Take 1,000 Units by mouth daily.     EPIPEN 2-PAK 0.3 MG/0.3ML SOAJ injection INJECT INTRAMUSCULARLY AS DIRECTED  0   ferrous sulfate 325 (65 FE) MG tablet Take 325 mg by mouth daily.      losartan (COZAAR) 25 MG tablet Take 0.5 tablets by mouth daily.     magnesium oxide (MAG-OX) 400 MG tablet Take 400 mg by mouth daily.     metoprolol succinate (TOPROL-XL) 50 MG 24 hr tablet Take 1 tablet (50 mg total) by mouth daily. Take with or immediately following a meal. 90 tablet 3   Multiple Vitamins-Minerals (MULTIVITAMIN ADULTS) TABS Take 1 tablet by mouth daily.     nitroGLYCERIN (NITROSTAT) 0.4 MG SL tablet Place 0.4 mg under the tongue as needed for chest pain.     Omega-3 1000 MG CAPS Take  1 tablet by mouth 2 (two) times daily.     potassium chloride (KLOR-CON) 10 MEQ tablet Take 10 mEq by mouth every evening.     pravastatin (PRAVACHOL) 80 MG tablet Take 80 mg by mouth daily.     spironolactone (ALDACTONE) 25 MG tablet Take 25 mg by mouth daily.      tobramycin (TOBREX) 0.3 % ophthalmic solution Place 1 drop into both eyes as needed.     torsemide (DEMADEX) 20 MG tablet Take 20 mg by mouth 2 (two) times daily.     vitamin B-12 (CYANOCOBALAMIN) 1000 MCG tablet Take 1,000 mcg by mouth daily.      No current facility-administered medications for this visit.    Allergies:   Januvia [sitagliptin], Keflex [cephalexin], Levetiracetam, Januvia [sitagliptin], Keflex [cephalexin], and Tape   Social History:  The patient  reports that she has never smoked. She has never used smokeless tobacco. She reports current alcohol use. She reports that she does not use drugs.   Family History:  The patient's family history includes Congestive Heart Failure in her mother; Diabetes in her mother; Heart attack in her father; High blood pressure in her mother; Throat cancer in her father.    ROS:  Please see the history of present illness.   Otherwise, review of systems is positive for none.   All other systems are reviewed and negative.    PHYSICAL EXAM: VS:  BP 110/70 (BP Location: Left Arm, Patient Position: Sitting, Cuff Size: Normal)   Pulse 88   Ht 5' 0.5" (1.537 m)   Wt 138 lb (62.6 kg)   SpO2 96%   BMI 26.51 kg/m  , BMI Body mass index is 26.51 kg/m. GEN: Well nourished, well developed, in no acute distress  HEENT: normal  Neck: no JVD, carotid bruits, or masses Cardiac: RRR; no murmurs, rubs, or gallops,no edema  Respiratory:  clear to auscultation bilaterally, normal work of breathing GI: soft, nontender, nondistended, + BS MS: no deformity or atrophy  Skin: warm and dry, device pocket is well healed Neuro:  Strength and sensation are intact Psych: euthymic mood, full affect  EKG:  EKG is not ordered today. Personal review of the ekg ordered 05/20/20 shows AV paced  Device interrogation is reviewed today in detail.  See PaceArt for details.   Recent Labs: 03/18/2020: ALT 22 03/19/2020: B Natriuretic Peptide 332.7; BUN 51; Creatinine, Ser 1.48; Hemoglobin 10.6; Platelets 269; Potassium 4.6; Sodium 137; TSH 1.002    Lipid Panel  No results found for: CHOL, TRIG, HDL, CHOLHDL, VLDL, LDLCALC, LDLDIRECT   Wt Readings from Last 3 Encounters:  08/05/20 138 lb (62.6 kg)  07/18/20  138 lb (62.6 kg)  05/20/20 137 lb (62.1 kg)      Other studies Reviewed: Additional studies/ records that were reviewed today include: TTE 06/13/20  Review of the above records today demonstrates:   1. Left ventricular ejection fraction, by estimation, is 60 to 65%. The  left ventricle has normal function. The left ventricle has no regional  wall motion abnormalities. Left ventricular diastolic parameters are  consistent with Grade I diastolic  dysfunction (impaired relaxation).   2. Right ventricular systolic function is mildly reduced. The right  ventricular size is normal. There is mildly elevated pulmonary artery  systolic pressure.   3. Left atrial size was mildly dilated.   4. The mitral valve is normal in structure. Mild mitral valve  regurgitation. Mild mitral stenosis. Moderate mitral annular  calcification.   5. The aortic  valve is normal in structure. Aortic valve regurgitation is  not visualized. There is a valve present in the aortic position. Procedure  Date: March 2011 with a #21 [facility] Ease bovine AVR. Echo findings  are consistent with normal  structure and function of the aortic valve prosthesis.   6. The inferior vena cava is normal in size with greater than 50%  respiratory variability, suggesting right atrial pressure of 3 mmHg.    ASSESSMENT AND PLAN:  1.  Third degree AV block: Status post Medtronic dual-chamber pacemaker implanted 4/20222.  Device functioning appropriately.  Relating to her device follow-up in our clinic.  2.  Coronary artery disease: Status post CABG.  No current chest pain.  3.  Aortic valve replacement: Currently asymptomatic.  Plan per primary cardiology.  4.  Atrial fibrillation/flutter: Unfortunately she has had a subarachnoid hemorrhage.  Holding off on anticoagulation for now.  If she is a candidate for watchman, she would need to reach be able to restart anticoagulation for short period of time.  We Cordella Nyquist discuss this with her  primary cardiologist.  I have seen Tyjuana Sotolongo is a 85 y.o. female in the office today. The patient is felt to be a poor candidate for long-term anticoagulation because of subarachnoid hemorrhage.  Their CHADS-2-Vasc Score and unadjusted Ischemic Stroke Rate (% per year) is equal to 9.7 % stroke rate/year from a score of 6, necessitating a strategy of stroke prevention with either long-term oral anticoagulation or left atrial appendage occlusion therapy. We have discussed their bleeding risk in the context of their comorbid medical problems, as well as the rationale for referral for evaluation of Watchman left atrial appendage occlusion therapy. While the patient is at high long-term bleeding risk, they may be appropriate for short-term anticoagulation. Based on this individual patient's stroke and bleeding risk, a shared decision has been made to refer the patient for consideration of Watchman left atrial appendage closure utilizing the [employer] of Cardiology shared decision tool.   Current medicines are reviewed at length with the patient today.   The patient does not have concerns regarding her medicines.  The following changes were made today: None  Labs/ tests ordered today include:  No orders of the defined types were placed in this encounter.    Disposition:   FU with Nirel Babler 1 year  Signed, Melvern Ramone Meredith Leeds, MD  08/05/2020 2:37 PM     [place] [address] [place] [address] [phone] (office) [phone] (fax)

## 2020-08-05 NOTE — Patient Instructions (Signed)
Medication Instructions:  Your physician recommends that you continue on your current medications as directed. Please refer to the Current Medication list given to you today.  *If you need a refill on your cardiac medications before your next appointment, please call your pharmacy*   Lab Work: None ordered If you have labs (blood work) drawn today and your tests are completely normal, you will receive your results only by: [place] (if you have MyChart) OR A paper copy in the mail If you have any lab test that is abnormal or we need to change your treatment, we will call you to review the results.   Testing/Procedures: None ordered   Follow-Up: At [facility], you and your health needs are our priority.  As part of our continuing mission to provide you with exceptional heart care, we have created designated Provider Care Teams.  These Care Teams include your primary Cardiologist (physician) and Advanced Practice Providers (APPs -  Physician Assistants and Nurse Practitioners) who all work together to provide you with the care you need, when you need it.  We recommend signing up for the patient portal called "MyChart".  Sign up information is provided on this After Visit Summary.  MyChart is used to connect with patients for Virtual Visits (Telemedicine).  Patients are able to view lab/test results, encounter notes, upcoming appointments, etc.  Non-urgent messages can be sent to your provider as well.   To learn more about what you can do with MyChart, go to [email or web address].    Your next appointment:   1 year(s)  The format for your next appointment:   In Person  Provider:   Allegra Lai, MD    Thank you for choosing [facility]!!   Trinidad Curet, RN [phone]   Other [facility] [phone]

## 2020-08-11 ENCOUNTER — Telehealth: Payer: Self-pay | Admitting: Cardiology

## 2020-08-11 NOTE — Telephone Encounter (Signed)
New message   Pt called to be scheduled for an appt with Dr. Quentin Ore to discuss watchman. She said she doesn't even know what that is and would like to speak to nurse.

## 2020-08-12 NOTE — Telephone Encounter (Signed)
Attempted to return call, busy signal...Marland KitchenMarland Kitchen

## 2020-08-13 NOTE — Telephone Encounter (Signed)
Attempted to reach pt again, busy signal again.Gabrielle KitchenMarland KitchenMarland Sanchez

## 2020-08-13 NOTE — Telephone Encounter (Signed)
Left message to call back on cell phone listed

## 2020-08-15 NOTE — Telephone Encounter (Signed)
Spoke to patient who confirms that Dr. Curt Bears did speak with her on Watchman, she just did not know that is what they called it. She is aware someone from office will call to arrange consult with Dr. Quentin Ore to further discuss.

## 2020-08-25 ENCOUNTER — Telehealth: Payer: Self-pay | Admitting: *Deleted

## 2020-08-25 NOTE — Telephone Encounter (Signed)
per Dr Curt Bears and Dr Bettina Gavia -  OK to proceed with short term a/c

## 2020-08-26 ENCOUNTER — Telehealth: Payer: Self-pay

## 2020-08-26 NOTE — Telephone Encounter (Signed)
Called to arrange Watchman consult per Dr. Curt Bears.  Left message to call back.

## 2020-08-27 DIAGNOSIS — G40909 Epilepsy, unspecified, not intractable, without status epilepticus: Secondary | ICD-10-CM | POA: Insufficient documentation

## 2020-08-27 DIAGNOSIS — I4892 Unspecified atrial flutter: Secondary | ICD-10-CM | POA: Insufficient documentation

## 2020-08-27 HISTORY — DX: Epilepsy, unspecified, not intractable, without status epilepticus: G40.909

## 2020-08-27 HISTORY — DX: Unspecified atrial flutter: I48.92

## 2020-08-27 NOTE — Telephone Encounter (Signed)
Scheduled the patient for Watchman evaluation with Dr. Quentin Ore on 10/24/2020. The patient was grateful for call and agrees with plan.

## 2020-09-25 ENCOUNTER — Ambulatory Visit: Payer: Medicare Other | Admitting: Neurology

## 2020-09-25 ENCOUNTER — Encounter: Payer: Self-pay | Admitting: Neurology

## 2020-09-25 VITALS — BP 154/73 | HR 80 | Ht 60.0 in | Wt 140.0 lb

## 2020-09-25 DIAGNOSIS — G40909 Epilepsy, unspecified, not intractable, without status epilepticus: Secondary | ICD-10-CM

## 2020-09-25 NOTE — Progress Notes (Signed)
[facility]  PATIENT: [name] DOB: 1935/09/29  REFERRING CLINICIAN: Verdell Carmine., MD HISTORY FROM: Patient  REASON FOR VISIT: Seizure   HISTORICAL  CHIEF COMPLAINT:  Chief Complaint  Patient presents with   New Patient (Initial Visit)    New patient; paper referral Subarachnoid hemorrhage and seizure disorder Room 72, alone in room    HISTORY OF PRESENT ILLNESS:  This is a 85 year old woman with past medical history of CAD status post pacemaker placement, hypertension, hyperlipidemia, diabetes mellitus, CKD who is presenting after a fall with concussion and subarachnoid hemorrhage and seizure.  Patient stated in the month of March she went to the hospital after a fall.  She hit her head really bad had a concussion and was diagnosed with subarachnoid hemorrhage.  In the following 2 weeks she has been going to the hospital 4 times and each time they told her that she might be dehydrated and sent home.  On the fourth time that she presented to the ED, she reported having 5 seizures that day.  She cannot recall the seizures but said that she would sit or stand and will blackout and and hit the ground, she reported that daughter has the video but she does not have it with her.  Denies tongue biting, no urinary incontinence. She reported being admitted for 2 nights and on the third night she had a cardiac arrest she then had a full work-up and was noted to be bradycardic.  She she had a  pacemaker placed.  Patient said that since having the pacemaker she has not had any syncope, has not had any seizures and has not had any additional complaint or concern.  When she left the hospital she was supposed to be on levetiracetam.  She reported taking it but had terrible side effect.  She says she was sleepy, she was tired, she was very irritable.  She has follow-up with a neurologist at [facility] who recommended to continue the levetiracetam.  Patient mentioned that after her  initial prescription ran out she did not refill the medication and has been doing very well since discontinuing the [place]. She reported her she started thinking clearly, she can see clearly she is not feeling too tired and now she is not irritable anymore.  Because of the fact that she feels much better, patient decided not to restart the levetiracetam.  Since then she reports she has been doing well no seizures  and no falls.     Handedness: Right handed   Seizure Type: Unclear, ?generalized   Current frequency: Last seizure in March 2022  Any injuries from seizures: No   Seizure risk factors: SAH, Concussion  Previous ASMs: Levetiracetam   [place] ASMs: None   ASMs side effects: Irritability, mood swing   Brain Images: CT 3/09 with [facility]  Previous EEGs: left focal slowing    OTHER MEDICAL CONDITIONS: CAD with pacemaker, HDL, DM controlled, CKD  REVIEW OF SYSTEMS: Full 14 system review of systems performed and negative with exception of: as noted in the HPI  ALLERGIES: Allergies  Allergen Reactions   Januvia [Sitagliptin] Itching and Swelling   Keflex [Cephalexin] Itching and Swelling   Levetiracetam Other (See Comments)    Joint pain, drowsiness   Januvia [Sitagliptin] Rash   Keflex [Cephalexin] Rash   Tape Rash    HOME MEDICATIONS: Outpatient Medications Prior to Visit  Medication Sig Dispense Refill   allopurinol (ZYLOPRIM) 100 MG tablet Take 100 mg by mouth 2 (two)  times daily.     aspirin EC 81 MG tablet Take 81 mg by mouth daily.     cholecalciferol (VITAMIN D3) 25 MCG (1000 UNIT) tablet Take 1,000 Units by mouth daily.     EPIPEN 2-PAK 0.3 MG/0.3ML SOAJ injection INJECT INTRAMUSCULARLY AS DIRECTED  0   ferrous sulfate 325 (65 FE) MG tablet Take 325 mg by mouth daily.      losartan (COZAAR) 25 MG tablet Take 0.5 tablets by mouth daily.     magnesium oxide (MAG-OX) 400 MG tablet Take 400 mg by mouth daily.     Multiple Vitamins-Minerals (MULTIVITAMIN ADULTS)  TABS Take 1 tablet by mouth daily.     nitroGLYCERIN (NITROSTAT) 0.4 MG SL tablet Place 0.4 mg under the tongue as needed for chest pain.     Omega-3 1000 MG CAPS Take 1 tablet by mouth 2 (two) times daily.     potassium chloride (KLOR-CON) 10 MEQ tablet Take 10 mEq by mouth every evening.     pravastatin (PRAVACHOL) 80 MG tablet Take 80 mg by mouth daily.     spironolactone (ALDACTONE) 25 MG tablet Take 25 mg by mouth daily.      tobramycin (TOBREX) 0.3 % ophthalmic solution Place 1 drop into both eyes as needed.     torsemide (DEMADEX) 20 MG tablet Take 20 mg by mouth 2 (two) times daily.     vitamin B-12 (CYANOCOBALAMIN) 1000 MCG tablet Take 1,000 mcg by mouth daily.     metoprolol succinate (TOPROL-XL) 50 MG 24 hr tablet Take 1 tablet (50 mg total) by mouth daily. Take with or immediately following a meal. 90 tablet 3   No facility-administered medications prior to visit.    PAST MEDICAL HISTORY: Past Medical History:  Diagnosis Date   Angina pectoris ([place]) 12/30/2014   Beat, premature ventricular 12/30/2014   CAD in native artery 12/30/2014   Cardiorenal disease 12/30/2014   Chronic diastolic heart failure ([place]) 12/30/2014   CKD (chronic kidney disease) stage 3, GFR 30-59 ml/min (HCC) 12/30/2014   Diabetes ([place])    Dyslipidemia 03/20/2018   H/O aortic valve replacement with tissue graft 12/30/2014   Overview:  S/P AVR with tissue valve, March 2011, #[facility] Ease bovine and CABG   High blood pressure    High cholesterol    Incarcerated incisional hernia 05/14/2015   Type 2 diabetes mellitus with peripheral angiopathy ([place]) 04/24/2015    PAST SURGICAL HISTORY: Past Surgical History:  Procedure Laterality Date   ABDOMINAL HERNIA REPAIR  2017   CARDIAC VALVE REPLACEMENT     CATARACT EXTRACTION W/ INTRAOCULAR LENS IMPLANT     CHOLECYSTECTOMY      FAMILY HISTORY: Family History  Problem Relation Age of Onset   High blood pressure Mother    Diabetes Mother     Congestive Heart Failure Mother    Throat cancer Father    Heart attack Father     SOCIAL HISTORY: Social History   Socioeconomic History   Marital status: Widowed    Spouse name: Not on file   Number of children: Not on file   Years of education: Not on file   Highest education level: Not on file  Occupational History   Not on file  Tobacco Use   Smoking status: Never   Smokeless tobacco: Never  Substance and Sexual Activity   Alcohol use: Yes   Drug use: No   Sexual activity: Not on file  Other Topics Concern   Not on file  Social History Narrative   Lives home alone   Right handed   Drinks 3-4 cups caffeine daily   Social Determinants of Health   Financial Resource Strain: Not on file  Food Insecurity: Not on file  Transportation Needs: Not on file  Physical Activity: Not on file  Stress: Not on file  Social Connections: Not on file  Intimate Partner Violence: Not on file     PHYSICAL EXAM  GENERAL EXAM/CONSTITUTIONAL: Vitals:  Vitals:   09/25/20 1408  BP: (!) 154/73  Pulse: 80  Weight: 140 lb (63.5 kg)  Height: 5' (1.524 m)   Body mass index is 27.34 kg/m. Wt Readings from Last 3 Encounters:  09/25/20 140 lb (63.5 kg)  08/05/20 138 lb (62.6 kg)  07/18/20 138 lb (62.6 kg)   Patient is in no distress; well developed, nourished and groomed; neck is supple  CARDIOVASCULAR: Examination of carotid arteries is normal; no carotid bruits Regular rate and rhythm, no murmurs Examination of peripheral vascular system by observation and palpation is normal  EYES: Pupils round and reactive to light, Visual fields full to confrontation, Extraocular movements intacts,   MUSCULOSKELETAL: Gait, strength, tone, movements noted in Neurologic exam below  NEUROLOGIC: MENTAL STATUS:  awake, alert, oriented to person, place and time recent and remote memory intact normal attention and concentration language fluent, comprehension intact, naming intact fund of  knowledge appropriate  CRANIAL NERVE:  2nd, 3rd, 4th, 6th - pupils equal and reactive to light, visual fields full to confrontation, extraocular muscles intact, no nystagmus 5th - facial sensation symmetric 7th - facial strength symmetric 8th - hearing intact 9th - palate elevates symmetrically, uvula midline 11th - shoulder shrug symmetric 12th - tongue protrusion midline  MOTOR:  normal bulk and tone, full strength in the BUE, BLE  SENSORY:  normal and symmetric to light touch  COORDINATION:  finger-nose-finger, fine finger movements normal   GAIT/STATION:  Able to stand unassisted, small steps, able to walk without assistance     DIAGNOSTIC DATA (LABS, IMAGING, TESTING) - I reviewed patient records, labs, notes, testing and imaging myself where available.  Lab Results  Component Value Date   WBC 14.4 (H) 03/19/2020   HGB 10.6 (L) 03/19/2020   HCT 33.1 (L) 03/19/2020   MCV 90.7 03/19/2020   PLT 269 03/19/2020      Component Value Date/Time   NA 137 03/19/2020 0422   NA 139 05/22/2015 1044   K 4.6 03/19/2020 0422   CL 103 03/19/2020 0422   CO2 21 (L) 03/19/2020 0422   GLUCOSE 145 (H) 03/19/2020 0422   BUN 51 (H) 03/19/2020 0422   BUN 37 (H) 05/22/2015 1044   CREATININE 1.48 (H) 03/19/2020 0422   CALCIUM 9.3 03/19/2020 0422   PROT 7.1 03/18/2020 1759   PROT 6.6 05/22/2015 1044   ALBUMIN 3.7 03/18/2020 1759   ALBUMIN 4.0 05/22/2015 1044   AST 26 03/18/2020 1759   ALT 22 03/18/2020 1759   ALKPHOS 50 03/18/2020 1759   BILITOT 0.6 03/18/2020 1759   BILITOT 0.2 05/22/2015 1044   GFRNONAA 35 (L) 03/19/2020 0422   GFRAA 50 (L) 05/22/2015 1044   No results found for: CHOL, HDL, LDLCALC, LDLDIRECT, TRIG Lab Results  Component Value Date   HGBA1C 6.3 (H) 03/19/2020   No results found for: [id number] Lab Results  Component Value Date   TSH 1.002 03/19/2020    Head CT 03/19/2020 1. Small volume posttraumatic subarachnoid hemorrhage has slightly decreased  since yesterday. Mild redistribution, including  trace IVH now. 2. No new traumatic injury to the brain identified. No new intracranial abnormality. 3. Right lateral scalp hematoma   MRI Brain 04/07/2020 1. Subarachnoid hemorrhage within left occipital sulci and lateral  aspect of the left middle cranial fossa.  2. An 8 mm T1 hypointense/T2 hyperintense focus which appear to be  intraparenchymal in the left inferior frontal gyrus, series 11,  image 50, suggestive of a small intraparenchymal hematoma near the petrous ridge.  3. Scattered foci of susceptibility artifact in the bilateral  frontal sulci, left temporal region, and bilateral cerebellar  hemispheres, suggesting prior microhemorrhages.  4. Mild chronic microangiopathic changes.  5. No evidence of high-grade flow-limiting stenosis or proximal  branch occlusion within the intracranial circulation.   EEG 07/02/2020 Abnormal EEG in waking, drowsiness and sleep performed with ST leads. Increased irregular theta/delta slowing in left temporal lobe with some sharp theta waves. This is a nonspecific finding which could indicate a structural lesion there which could be epileptogenic.     ASSESSMENT AND PLAN  85 y.o. year old female  with past medical history CAD status post pacemaker placement, hypertension hyperlipidemia, diabetes, fall complicated by concussion and subarachnoid hemorrhage who is presenting for management of her seizures.  Patient stated after the falls and subarachnoid hemorrhage she developed seizure-like activity.  She was discharged home with Keppra for her seizures.  Patient was taking the Keppra but continued to have seizure-like activity.  She presented to the hospital again was admitted and on the third night had a cardiac arrest.  She was noted to be very bradycardic and ended up having a pacemaker placement.  Patient reported since the pacemaker, she has not had any syncope, she has not had any seizure-like activity.  She  self discontinued the Keppra after having side effect of mood swings, irritability, tiredness and pain.  She reports she feels much better after discontinuing the Keppra.  Again no additional syncope or seizure-like activity since the placement of the pacemaker.  She did have a EEG which showed a left temporal slowing.  Based on the history it is unclear if patient truly have epileptic seizures versus convulsive syncope.  She was discharged on Keppra and was still having seizure-like activity which ceased after the placement of a pacemaker.  At this point I think it is reasonable to hold on Keppra and continue to monitor patient however I will order a 24-hour EEG and based on the result I will contact patient to discuss further recommendation.  For instance if the EEG is normal then I think is reasonable for patient to not continue a antiseizure medication and to continue to watch and closely monitor her.  If the EEG is abnormal for instance showing any spikes then I will discussed with patient regarding restarting an antiseizure medication.  This plan was discussed with the patient and she is agreeable with it.  Return precautions given to patient.   1. Seizure disorder [facility])       PLAN: Continue your other medication  Ok to Drive  Will contact you after the EEG, if normal can follow up with your PCP     Per [facility] statutes, patients with seizures are not allowed to drive until they have been seizure-free for six months.  Other recommendations include using caution when using heavy equipment or power tools. Avoid working on ladders or at heights. Take showers instead of baths.  Do not swim alone.  Ensure the water temperature is not too high  on the home water heater. Do not go swimming alone. Do not lock yourself in a room alone (i.e. bathroom). When caring for infants or small children, sit down when holding, feeding, or changing them to minimize risk of injury to the child in the event  you have a seizure. Maintain good sleep hygiene. Avoid alcohol.  Also recommend adequate sleep, hydration, good diet and minimize stress.   During the Seizure  - First, ensure adequate ventilation and place patients on the floor on their left side  Loosen clothing around the neck and ensure the airway is patent. If the patient is clenching the teeth, do not force the mouth open with any object as this can cause severe damage - Remove all items from the surrounding that can be hazardous. The patient may be oblivious to what's happening and may not even know what he or she is doing. If the patient is confused and wandering, either gently guide him/her away and block access to outside areas - Reassure the individual and be comforting - Call 911. In most cases, the seizure ends before EMS arrives. However, there are cases when seizures may last over 3 to 5 minutes. Or the individual may have developed breathing difficulties or severe injuries. If a pregnant patient or a person with diabetes develops a seizure, it is prudent to call an ambulance. - Finally, if the patient does not regain full consciousness, then call EMS. Most patients will remain confused for about 45 to 90 minutes after a seizure, so you must use judgment in calling for help. - Avoid restraints but make sure the patient is in a bed with padded side rails - Place the individual in a lateral position with the neck slightly flexed; this will help the saliva drain from the mouth and prevent the tongue from falling backward - Remove all nearby furniture and other hazards from the area - Provide verbal assurance as the individual is regaining consciousness - Provide the patient with privacy if possible - Call for help and start treatment as ordered by the caregiver   After the Seizure (Postictal Stage)  After a seizure, most patients experience confusion, fatigue, muscle pain and/or a headache. Thus, one should permit the individual to  sleep. For the next few days, reassurance is essential. Being calm and helping reorient the person is also of importance.  Most seizures are painless and end spontaneously. Seizures are not harmful to others but can lead to complications such as stress on the lungs, brain and the heart. Individuals with prior lung problems may develop labored breathing and respiratory distress.     Orders Placed This Encounter  Procedures   EEG adult    No orders of the defined types were placed in this encounter.   Return if symptoms worsen or fail to improve.    Alric Ran, MD 09/25/2020, 2:45 PM  [facility] [address] [place], [place] [address] [phone]

## 2020-09-25 NOTE — Patient Instructions (Addendum)
Continue your other medication  Ok to Drive  Will contact you after the EEG, if normal can follow up with your PCP     Per [facility] statutes, patients with seizures are not allowed to drive until they have been seizure-free for six months.  Other recommendations include using caution when using heavy equipment or power tools. Avoid working on ladders or at heights. Take showers instead of baths.  Do not swim alone.  Ensure the water temperature is not too high on the home water heater. Do not go swimming alone. Do not lock yourself in a room alone (i.e. bathroom). When caring for infants or small children, sit down when holding, feeding, or changing them to minimize risk of injury to the child in the event you have a seizure. Maintain good sleep hygiene. Avoid alcohol.  Also recommend adequate sleep, hydration, good diet and minimize stress.   During the Seizure  - First, ensure adequate ventilation and place patients on the floor on their left side  Loosen clothing around the neck and ensure the airway is patent. If the patient is clenching the teeth, do not force the mouth open with any object as this can cause severe damage - Remove all items from the surrounding that can be hazardous. The patient may be oblivious to what's happening and may not even know what he or she is doing. If the patient is confused and wandering, either gently guide him/her away and block access to outside areas - Reassure the individual and be comforting - Call 911. In most cases, the seizure ends before EMS arrives. However, there are cases when seizures may last over 3 to 5 minutes. Or the individual may have developed breathing difficulties or severe injuries. If a pregnant patient or a person with diabetes develops a seizure, it is prudent to call an ambulance. - Finally, if the patient does not regain full consciousness, then call EMS. Most patients will remain confused for about 45 to 90 minutes after a  seizure, so you must use judgment in calling for help. - Avoid restraints but make sure the patient is in a bed with padded side rails - Place the individual in a lateral position with the neck slightly flexed; this will help the saliva drain from the mouth and prevent the tongue from falling backward - Remove all nearby furniture and other hazards from the area - Provide verbal assurance as the individual is regaining consciousness - Provide the patient with privacy if possible - Call for help and start treatment as ordered by the caregiver   After the Seizure (Postictal Stage)  After a seizure, most patients experience confusion, fatigue, muscle pain and/or a headache. Thus, one should permit the individual to sleep. For the next few days, reassurance is essential. Being calm and helping reorient the person is also of importance.  Most seizures are painless and end spontaneously. Seizures are not harmful to others but can lead to complications such as stress on the lungs, brain and the heart. Individuals with prior lung problems may develop labored breathing and respiratory distress.

## 2020-09-29 ENCOUNTER — Telehealth: Payer: Self-pay | Admitting: Neurology

## 2020-09-29 NOTE — Telephone Encounter (Signed)
24 hr EEG order faxed to [facility], they will reach out to pt to schedule.

## 2020-09-30 NOTE — Telephone Encounter (Signed)
Pt is scheduled for her In-Home Video EEG from 10/11 - 10/22/20

## 2020-10-20 DIAGNOSIS — G40909 Epilepsy, unspecified, not intractable, without status epilepticus: Secondary | ICD-10-CM

## 2020-10-23 ENCOUNTER — Encounter: Payer: Self-pay | Admitting: Neurology

## 2020-10-23 DIAGNOSIS — G40909 Epilepsy, unspecified, not intractable, without status epilepticus: Secondary | ICD-10-CM

## 2020-10-23 NOTE — Progress Notes (Signed)
Electrophysiology Office Follow up Visit Note:    Date:  10/24/2020   ID:  [name], DOB March 15, 1935, MRN [id number]  PCP:  Verdell Carmine., MD  [place] Cardiologist:  None  [facility] Electrophysiologist:  None    Interval History:    [name] is a 85 y.o. female who presents for a follow up visit.  They were previously seen by Dr. Curt Bears on August 05, 2020.  Her medical history is significant for aortic valve replacement in March 2011, hypertension, CKD, PVCs, paroxysmal atrial fibrillation.  She has a history of traumatic subarachnoid hemorrhage.  She fell after passing out and struck her head.  She was determined to have suffered a small subarachnoid hemorrhage as a result of the fall.  She is followed by [facility] neurology.  She has a pacemaker that was implanted in April 2020 for complete heart block.  Per primary cardiologist, Dr. Bettina Gavia, okay to restart anticoagulation. The patient was seen by [facility] with [facility] on September 25, 2020.      Past Medical History:  Diagnosis Date   Angina pectoris ([place]) 12/30/2014   Beat, premature ventricular 12/30/2014   CAD in native artery 12/30/2014   Cardiorenal disease 12/30/2014   Chronic diastolic heart failure ([place]) 12/30/2014   CKD (chronic kidney disease) stage 3, GFR 30-59 ml/min ([place]) 12/30/2014   Diabetes ([place])    Dyslipidemia 03/20/2018   H/O aortic valve replacement with tissue graft 12/30/2014   Overview:  S/P AVR with tissue valve, March 2011, #21 [facility] Ease bovine and CABG   High blood pressure    High cholesterol    Incarcerated incisional hernia 05/14/2015   Type 2 diabetes mellitus with peripheral angiopathy ([place]) 04/24/2015    Past Surgical History:  Procedure Laterality Date   ABDOMINAL HERNIA REPAIR  2017   CARDIAC VALVE REPLACEMENT     CATARACT EXTRACTION W/ INTRAOCULAR LENS IMPLANT     CHOLECYSTECTOMY      Current Medications: Current Meds  Medication Sig    allopurinol (ZYLOPRIM) 100 MG tablet Take 100 mg by mouth 2 (two) times daily.   aspirin EC 81 MG tablet Take 81 mg by mouth daily.   cholecalciferol (VITAMIN D3) 25 MCG (1000 UNIT) tablet Take 1,000 Units by mouth daily.   EPIPEN 2-PAK 0.3 MG/0.3ML SOAJ injection INJECT INTRAMUSCULARLY AS DIRECTED   ferrous sulfate 325 (65 FE) MG tablet Take 325 mg by mouth daily.    losartan (COZAAR) 25 MG tablet Take 0.5 tablets by mouth daily.   magnesium oxide (MAG-OX) 400 MG tablet Take 400 mg by mouth daily.   Multiple Vitamins-Minerals (MULTIVITAMIN ADULTS) TABS Take 1 tablet by mouth daily.   nitroGLYCERIN (NITROSTAT) 0.4 MG SL tablet Place 0.4 mg under the tongue as needed for chest pain.   Omega-3 1000 MG CAPS Take 1 tablet by mouth 2 (two) times daily.   potassium chloride (KLOR-CON) 10 MEQ tablet Take 10 mEq by mouth every evening.   pravastatin (PRAVACHOL) 80 MG tablet Take 80 mg by mouth daily.   spironolactone (ALDACTONE) 25 MG tablet Take 25 mg by mouth daily.    tobramycin (TOBREX) 0.3 % ophthalmic solution Place 1 drop into both eyes as needed.   torsemide (DEMADEX) 20 MG tablet Take 20 mg by mouth 2 (two) times daily.   vitamin B-12 (CYANOCOBALAMIN) 1000 MCG tablet Take 1,000 mcg by mouth daily.     Allergies:   Januvia [sitagliptin], Keflex [cephalexin], Levetiracetam, Januvia [sitagliptin], Keflex [cephalexin], and  Tape   Social History   Socioeconomic History   Marital status: Widowed    Spouse name: Not on file   Number of children: Not on file   Years of education: Not on file   Highest education level: Not on file  Occupational History   Not on file  Tobacco Use   Smoking status: Never   Smokeless tobacco: Never  Substance and Sexual Activity   Alcohol use: Yes   Drug use: No   Sexual activity: Not on file  Other Topics Concern   Not on file  Social History Narrative   Lives home alone   Right handed   Drinks 3-4 cups caffeine daily   Social Determinants of Health    Financial Resource Strain: Not on file  Food Insecurity: Not on file  Transportation Needs: Not on file  Physical Activity: Not on file  Stress: Not on file  Social Connections: Not on file     Family History: The patient's family history includes Congestive Heart Failure in her mother; Diabetes in her mother; Heart attack in her father; High blood pressure in her mother; Throat cancer in her father.  ROS:   Please see the history of present illness.    All other systems reviewed and are negative.  EKGs/Labs/Other Studies Reviewed:    The following studies were reviewed today: Prior records  June 13, 2020 echo Left ventricular function normal, 60% Right ventricular function mildly reduced Mildly dilated left atrium Mild MR Post aortic valve replacement   October 24, 2020 device interrogation personally reviewed Battery longevity 12 years Less than 1% burden of A. fib Lead parameters are stable V pacing at VVI 30 3 NSVT, longest 11 beats     Recent Labs: 03/18/2020: ALT 22 03/19/2020: B Natriuretic Peptide 332.7; BUN 51; Creatinine, Ser 1.48; Hemoglobin 10.6; Platelets 269; Potassium 4.6; Sodium 137; TSH 1.002  Recent Lipid Panel No results found for: CHOL, TRIG, HDL, CHOLHDL, VLDL, LDLCALC, LDLDIRECT  Physical Exam:    VS:  BP 126/72   Pulse 92   Ht '5\' 3"'$  (1.6 m)   Wt 145 lb (65.8 kg)   BMI 25.69 kg/m     Wt Readings from Last 3 Encounters:  10/24/20 145 lb (65.8 kg)  09/25/20 140 lb (63.5 kg)  08/05/20 138 lb (62.6 kg)     GEN:  Well nourished, well developed in no acute distress HEENT: Normal NECK: No JVD; No carotid bruits LYMPHATICS: No lymphadenopathy CARDIAC: RRR, no murmurs, rubs, gallops.  Pacemaker pocket well-healed RESPIRATORY:  Clear to auscultation without rales, wheezing or rhonchi  ABDOMEN: Soft, non-tender, non-distended MUSCULOSKELETAL:  No edema; No deformity  SKIN: Warm and dry NEUROLOGIC:  Alert and oriented x 3 PSYCHIATRIC:   Normal affect        ASSESSMENT:    1. Atypical atrial flutter (HCC)   2. Subarachnoid hemorrhage ([place])   3. CAD in native artery   4. Hx of CABG   5. H/O aortic valve replacement with tissue graft   6. Heart block AV complete (HCC)   7. Cardiac pacemaker in situ    PLAN:    In order of problems listed above:  1. Atypical atrial flutter (HCC) 2. Subarachnoid hemorrhage ([place])  Patient with a history of atrial arrhythmias.  Low burden overall, less than 1% on device interrogation.  Currently not taking an anticoagulant despite her elevated CHA2DS2-VASc.  Her history of intracranial hemorrhage was traumatic after she fell from complete heart block.  Thankfully,  her complete heart block is now treated with a permanent pacemaker that is functioning well.  She is interested in avoiding long-term exposure to anticoagulation given her history of bleeding.  We discussed the watchman procedure in detail including the risks, need for short-term anticoagulation and she wishes to proceed.  Dr. Bettina Gavia who is her primary cardiologist has reported her to be okay for short-term anticoagulation.  After long discussion about watchman versus continued anticoagulation, patient elected to pursue watchman implant.  We will find a date for her to have her implant.  We will have her start Eliquis 2.5 mg by mouth twice daily 4 weeks prior to the watchman implant.  She will continue with Eliquis 2.5 mg by mouth twice daily and aspirin 81 mg by mouth once daily for at least 45 days after watchman implant.  If the transesophageal echo shows a well-seated device without thrombus at 45 days post implant, would plan to transition to Plavix monotherapy to complete 6 months of post implant anticoagulation/antiplatelet therapy.  She will need a CT PV protocol prior to the procedure.  -----------  I have seen [name] in the office today who is being considered for a Watchman left atrial appendage closure device. I  believe they will benefit from this procedure given their history of atrial fibrillation, CHA2DS2-VASc score of 4 and unadjusted ischemic stroke rate of 4.8% per year. Unfortunately, the patient is not felt to be a long term anticoagulation candidate secondary to history of intracranial bleed. The patient's chart has been reviewed and I feel that they would be a candidate for short term oral anticoagulation after Watchman implant.   It is my belief that after undergoing a LAA closure procedure, [name] will not need long term anticoagulation which eliminates anticoagulation side effects and major bleeding risk.   Procedural risks for the Watchman implant have been reviewed with the patient including a 0.5% risk of stroke, <1% risk of perforation and <1% risk of device embolization.    The published clinical data on the safety and effectiveness of WATCHMAN include but are not limited to the following: - Holmes DR, Mechele Claude, Sick P et al. for the PROTECT AF Investigators. Percutaneous closure of the left atrial appendage versus warfarin therapy for prevention of stroke in patients with atrial fibrillation: a randomised non-inferiority trial. Lancet 2009; 374: 534-42. Mechele Claude, Doshi SK, Abelardo Diesel D et al. on behalf of the PROTECT AF Investigators. Percutaneous Left Atrial Appendage Closure for Stroke Prophylaxis in Patients With Atrial Fibrillation 2.3-Year Follow-up of the PROTECT AF (Watchman Left Atrial Appendage System for Embolic Protection in Patients With Atrial Fibrillation) Trial. Circulation 2013; 127:720-729. - Alli O, Doshi S,  Kar S, Reddy VY, Sievert H et al. Quality of Life Assessment in the Randomized PROTECT AF (Percutaneous Closure of the Left Atrial Appendage Versus Warfarin Therapy for Prevention of Stroke in Patients With Atrial Fibrillation) Trial of Patients at Risk for Stroke With Nonvalvular Atrial Fibrillation. J Am Coll Cardiol 2013; P4788364. Vertell Limber DR, Tarri Abernethy,  Price M, Higbee, Sievert H, Doshi S, Huber K, Reddy V. Prospective randomized evaluation of the Watchman left atrial appendage Device in patients with atrial fibrillation versus long-term warfarin therapy; the PREVAIL trial. Journal of the [employer] of Cardiology, Vol. 4, No. 1, 2014, 1-11. - Kar S, Doshi SK, Sadhu A, Horton R, Osorio J et al. Primary outcome evaluation of a next-generation left atrial appendage closure device: results from the PINNACLE FLX trial. Circulation  [id number].    After today's visit with the patient which was dedicated solely for shared decision making visit regarding LAA closure device, the patient decided to proceed with the LAA appendage closure procedure scheduled to be done in the near future at [facility]. Prior to the procedure, I would like to obtain a gated CT scan of the chest with contrast timed for PV/LA visualization.    3. CAD in native artery 4. Hx of CABG 5. H/O aortic valve replacement with tissue graft No ischemic symptoms today.  No evidence of prosthetic valve dysfunction.   6. Heart block AV complete ([place]) 7. Cardiac pacemaker in situ Device functioning appropriately.  Device dependent.    Total time spent with patient today 65 minutes. This includes reviewing records, evaluating the patient and coordinating care.   Medication Adjustments/Labs and Tests Ordered: Current medicines are reviewed at length with the patient today.  Concerns regarding medicines are outlined above.  Orders Placed This Encounter  Procedures   CT CARDIAC MORPH/PULM VEIN W/CM&W/O CA SCORE   Basic Metabolic Panel (BMET)   No orders of the defined types were placed in this encounter.    Signed, Lars Mage, MD, [facility], [facility] 10/24/2020 1:10 PM    Electrophysiology [facility]

## 2020-10-23 NOTE — Procedures (Signed)
     Description 24 Hours Ambulatory Video EEG  TECHNICAL DESCRIPTION: This AVEEG was performed using equipment provided by [employer] utilizing Bluetooth (Trackit ) amplifiers with continuous EEGT attended video collection using encrypted remote transmission via [place] secured cellular tower network with data rates for each AVEEG performed. This is a Biomedical engineer AVEEG, obtained, according to the 10-20 international electrode placement system, reformatted digitally into referential and bipolar montages. Data was acquired with a minimum of 21 bipolar connections and sampled at a minimum rate of 250 cycles per second per channel, maximum rate of 450 cycles per second per channel and two channels for EKG. The entire AVEEG study was recorded through cable and or radio telemetry for subsequent analysis. Specified epochs of the AVEEG data were identified at the direction of the subject by the depression of a push button by the patient. Each patient's event file included data acquired two minutes prior to the push button activation and continuing until two minutes afterwards. AVEEG files were reviewed on [employer] Platform by [employer] with a digital high frequency filter set at 70 Hz and a low frequency filter set at 0.1 Hz with a paper speed of 75m/s resulting in 10 seconds per digital page. This entire AVEEG was reviewed by the EEG Technologist. Random time samples, random sleep samples, clips, patient initiated push button files with included patient daily diary logs, EEG Technologist bookmarked note data was reviewed and verified for accuracy and validity by the governing reading neurologist in full details. This AEEGV was fully compliant with all requirements for CPT 97500 for setup, patient education, take down and administered by an EEG technologist.   INTERMITTENT MONITORING WITH VIDEO TECHNICAL DESCRIPTION:  This Long-Term AVEEG was monitored intermittently by a qualified EEG technologist for  the entirety of the recording; quality check-ins were performed at a minimum of every two hours, checking and documenting real-time data and video to assure the integrity and quality of the recording (e.g., camera position, electrode integrity and impedance), and identify the need for maintenance. For intermittent monitoring, an EEG Technologist monitored no more than 12 patients concurrently. Diagnostic video was captured at least 80% of the time during the recording.   TECHNOLOGIST EVENTS: No clear epileptiform activity was detected by the reviewing neurodiagnostic technologist during the recording for further evaluation. F7 electrode artifact was noted.  PATIENT EVENTS:  The patient pushed the event button 0 times during the AVEEG recording. However, EEGT test press #1 time with pt at the beginning of the tracing.  TIME SAMPLES: 1 minute of every hour recorded are reviewed as random time samples.   SLEEP SAMPLES: 5 minutes of every 24 hour recorded sleep cycle are reviewed as random sleep samples.   BACKGROUND EEG: This AVEEG consists of well-modulated bilateral synchronous and symmetrical background in the alpha frequencies in the awake state.  AWAKE: The posterior dominant rhythm was characterized by symmetric and reactive 8-9 Hz activity with eyes closed.   SLEEP: Stage two sleep displayed Sleep Spindles and Vertex Waveform components. All other sleep stages appeared symmetrical and synchronous with regards to K-Complexes, and REM.  EKG: Normal sinus rhythm.    AVEEG Interpretations: This is a normal ambulatory video EEG. A normal EEG does not exclude a diagnosis of epilepsy.    AAlric Ran MD [facility]

## 2020-10-24 ENCOUNTER — Encounter: Payer: Self-pay | Admitting: Cardiology

## 2020-10-24 ENCOUNTER — Other Ambulatory Visit: Payer: Self-pay

## 2020-10-24 ENCOUNTER — Encounter (INDEPENDENT_AMBULATORY_CARE_PROVIDER_SITE_OTHER): Payer: Self-pay

## 2020-10-24 ENCOUNTER — Ambulatory Visit: Payer: Medicare Other | Admitting: Cardiology

## 2020-10-24 VITALS — BP 126/72 | HR 92 | Ht 63.0 in | Wt 145.0 lb

## 2020-10-24 DIAGNOSIS — I484 Atypical atrial flutter: Secondary | ICD-10-CM

## 2020-10-24 DIAGNOSIS — I442 Atrioventricular block, complete: Secondary | ICD-10-CM

## 2020-10-24 DIAGNOSIS — Z950 Presence of cardiac pacemaker: Secondary | ICD-10-CM

## 2020-10-24 DIAGNOSIS — I609 Nontraumatic subarachnoid hemorrhage, unspecified: Secondary | ICD-10-CM

## 2020-10-24 DIAGNOSIS — I2510 Atherosclerotic heart disease of native coronary artery without angina pectoris: Secondary | ICD-10-CM

## 2020-10-24 DIAGNOSIS — Z954 Presence of other heart-valve replacement: Secondary | ICD-10-CM

## 2020-10-24 DIAGNOSIS — Z951 Presence of aortocoronary bypass graft: Secondary | ICD-10-CM

## 2020-10-24 NOTE — Progress Notes (Signed)
Contacted patient, informed her that her EEG was normal, no epileptiform discharges and no seizures.  At this point we will hold antiseizure medication, and she can follow-up with her primary care doctor.  Advised her to contact me if she has any seizure-like activity, return if worse or any other concern or complaint.  Guthrie Lemme

## 2020-10-24 NOTE — Patient Instructions (Addendum)
Medication Instructions:  Your physician recommends that you continue on your current medications as directed. Please refer to the Current Medication list given to you today. *If you need a refill on your cardiac medications before your next appointment, please call your pharmacy*  Lab Work: You will get lab work today:  BMP  If you have labs (blood work) drawn today and your tests are completely normal, you will receive your results only by: [place] (if you have MyChart) OR A paper copy in the mail If you have any lab test that is abnormal or we need to change your treatment, we will call you to review the results.  Testing/Procedures: Your physician has requested that you have cardiac CT. Cardiac computed tomography (CT) is a painless test that uses an x-ray machine to take clear, detailed pictures of your heart.     Follow-Up:  Lenice Llamas RN nurse navigator will call you when your testing is complete.

## 2020-10-25 LAB — BASIC METABOLIC PANEL
BUN/Creatinine Ratio: 32 — ABNORMAL HIGH (ref 12–28)
BUN: 55 mg/dL — ABNORMAL HIGH (ref 8–27)
CO2: 28 mmol/L (ref 20–29)
Calcium: 9.8 mg/dL (ref 8.7–10.3)
Chloride: 101 mmol/L (ref 96–106)
Creatinine, Ser: 1.73 mg/dL — ABNORMAL HIGH (ref 0.57–1.00)
Glucose: 144 mg/dL — ABNORMAL HIGH (ref 70–99)
Potassium: 4.5 mmol/L (ref 3.5–5.2)
Sodium: 143 mmol/L (ref 134–144)
eGFR: 29 mL/min/{1.73_m2} — ABNORMAL LOW (ref >59)

## 2020-11-04 ENCOUNTER — Telehealth: Payer: Self-pay

## 2020-11-04 ENCOUNTER — Ambulatory Visit (INDEPENDENT_AMBULATORY_CARE_PROVIDER_SITE_OTHER): Payer: Medicare Other

## 2020-11-04 DIAGNOSIS — I442 Atrioventricular block, complete: Secondary | ICD-10-CM | POA: Diagnosis not present

## 2020-11-04 LAB — CUP PACEART REMOTE DEVICE CHECK
Battery Remaining Longevity: 144 mo
Battery Voltage: 3.12 V
Brady Statistic AP VP Percent: 36.7 %
Brady Statistic AP VS Percent: 0 %
Brady Statistic AS VP Percent: 63.27 %
Brady Statistic AS VS Percent: 0.03 %
Brady Statistic RA Percent Paced: 36.7 %
Brady Statistic RV Percent Paced: 99.97 %
Date Time Interrogation Session: 20221025055105
Implantable Lead Implant Date: 20220331
Implantable Lead Implant Date: 20220331
Implantable Lead Location: 753859
Implantable Lead Location: 753860
Implantable Lead Model: 5076
Implantable Lead Model: 5076
Implantable Pulse Generator Implant Date: 20220331
Lead Channel Impedance Value: 304 Ohm
Lead Channel Impedance Value: 380 Ohm
Lead Channel Impedance Value: 494 Ohm
Lead Channel Impedance Value: 570 Ohm
Lead Channel Pacing Threshold Amplitude: 0.625 V
Lead Channel Pacing Threshold Amplitude: 0.75 V
Lead Channel Pacing Threshold Pulse Width: 0.4 ms
Lead Channel Pacing Threshold Pulse Width: 0.4 ms
Lead Channel Sensing Intrinsic Amplitude: 10.875 mV
Lead Channel Sensing Intrinsic Amplitude: 10.875 mV
Lead Channel Sensing Intrinsic Amplitude: 2.625 mV
Lead Channel Sensing Intrinsic Amplitude: 2.625 mV
Lead Channel Setting Pacing Amplitude: 1.5 V
Lead Channel Setting Pacing Amplitude: 2 V
Lead Channel Setting Pacing Pulse Width: 0.4 ms
Lead Channel Setting Sensing Sensitivity: 1.2 mV

## 2020-11-04 NOTE — Telephone Encounter (Signed)
Pre-Watchman CT labs show creatinine = 1.73 and GFR = 29.  Per [facility] protocol, the patient will need Nephrology clearance prior to CT scan.  Spoke with the patient. The patient does not have a nephrologist but her PCP follows her kidney function closely. She understands CT scan 10/27 is canceled and she will be called with a new plan once discussed with Dr. Quentin Ore.   Dr. Quentin Ore, I plan to bring the patient in for a visit with Sharee Pimple to start Eliquis and arrange pre-Watchman TEE (instead of CT). I would schedule the patient for LAAO 4 weeks later so that 1 visit could cover both procedures. Please let me know if you are agreeable to this and I will get her scheduled.

## 2020-11-06 ENCOUNTER — Ambulatory Visit (HOSPITAL_COMMUNITY): Admission: RE | Admit: 2020-11-06 | Payer: Medicare Other | Source: Ambulatory Visit

## 2020-11-11 NOTE — Telephone Encounter (Signed)
Left message to call back to discuss new Watchman plan.

## 2020-11-12 NOTE — Telephone Encounter (Signed)
The patient agrees with plan to have visit, TEE, and then Bee. Gave patient several dates for LAAO. She requests a call back tomorrow to discuss scheduling after having time to look at calendar. She was grateful for call and agrees with plan.

## 2020-11-13 ENCOUNTER — Other Ambulatory Visit: Payer: Self-pay

## 2020-11-13 DIAGNOSIS — I609 Nontraumatic subarachnoid hemorrhage, unspecified: Secondary | ICD-10-CM

## 2020-11-13 DIAGNOSIS — I484 Atypical atrial flutter: Secondary | ICD-10-CM

## 2020-11-13 NOTE — Progress Notes (Signed)
Remote pacemaker transmission.   

## 2020-11-13 NOTE — Telephone Encounter (Signed)
Discussed with the patient. She agrees to visit with Nell Range, PA on 11/17. Scheduled her for pre-Watchman TEE on 12/5 and she understands she will get instructions for TEE at 11/17 visit. She understands she will be called after Dr. Quentin Ore reviews TEE and if anatomy is amenable to [place], she will have Watchman procedure 12/25/2020. She was grateful for assistance and agrees with plan.

## 2020-11-26 NOTE — Progress Notes (Signed)
[place]                                     Cardiology Office Note:    Date:  11/27/2020   ID:  [name], DOB 11-05-1935, MRN [id number]  PCP:  Verdell Carmine., MD  [facility] Cardiologist:  Shirlee More, MD  [place] Electrophysiologist:  Vickie Epley, MD   Referring MD: Verdell Carmine., MD   CC: set up Watchman for pre LAAO planning.   History of Present Illness:    [name] is a 85 y.o. female with a hx of aortic valve replacement in March 2011, CHB s/p PPM (2020), hypertension, CKD stage IV, PVCs, paroxysmal atrial fibrillation and traumatic subarachnoid hemorrhage who presents to clinic to discuss pre Watchman TEE.  She has a history of traumatic subarachnoid hemorrhage.  She fell after passing out and struck her head.  She was determined to have suffered a small subarachnoid hemorrhage as a result of the fall.  She is followed by [facility] neurology.  She has a pacemaker that was implanted in April 2020 for complete heart block.  Per primary cardiologist, Dr. Bettina Gavia, okay to restart anticoagulation.  She was seen in the office by Dr. Quentin Ore on 10/24/20 for consideration of Watchman. She was set up for CT to assess cardiac anatomy but this was later switched to a TEE because of worsening in renal function with a creat of 1.7 and GFR of 29.  Today the patient presents to clinic for follow up. She is doing well today. Here alone. No CP or SOB. No LE edema, orthopnea or PND. No dizziness or syncope. No blood in stool or urine. No palpitations.     Past Medical History:  Diagnosis Date   Angina pectoris ([place]) 12/30/2014   Beat, premature ventricular 12/30/2014   CAD in native artery 12/30/2014   Cardiorenal disease 12/30/2014   Chronic diastolic heart failure ([place]) 12/30/2014   CKD (chronic kidney disease) stage 3, GFR 30-59 ml/min ([place]) 12/30/2014   Diabetes ([place])    Dyslipidemia 03/20/2018   H/O  aortic valve replacement with tissue graft 12/30/2014   Overview:  S/P AVR with tissue valve, March 2011, #21 [facility] Ease bovine and CABG   High blood pressure    High cholesterol    Incarcerated incisional hernia 05/14/2015   Type 2 diabetes mellitus with peripheral angiopathy ([place]) 04/24/2015    Past Surgical History:  Procedure Laterality Date   ABDOMINAL HERNIA REPAIR  2017   CARDIAC VALVE REPLACEMENT     CATARACT EXTRACTION W/ INTRAOCULAR LENS IMPLANT     CHOLECYSTECTOMY      Current Medications: Current Meds  Medication Sig   allopurinol (ZYLOPRIM) 100 MG tablet Take 100 mg by mouth 2 (two) times daily.   apixaban (ELIQUIS) 2.5 MG TABS tablet Take 1 tablet (2.5 mg total) by mouth 2 (two) times daily.   aspirin EC 81 MG tablet Take 81 mg by mouth daily.   cholecalciferol (VITAMIN D3) 25 MCG (1000 UNIT) tablet Take 1,000 Units by mouth daily.   EPIPEN 2-PAK 0.3 MG/0.3ML SOAJ injection INJECT INTRAMUSCULARLY AS DIRECTED   ferrous sulfate 325 (65 FE) MG tablet Take 325 mg by mouth daily.    losartan (COZAAR) 25 MG tablet Take 0.5 tablets by mouth daily.   magnesium oxide (MAG-OX) 400 MG tablet  Take 400 mg by mouth daily.   Multiple Vitamins-Minerals (MULTIVITAMIN ADULTS) TABS Take 1 tablet by mouth daily.   nitroGLYCERIN (NITROSTAT) 0.4 MG SL tablet Place 0.4 mg under the tongue as needed for chest pain.   Omega-3 1000 MG CAPS Take 1 tablet by mouth 2 (two) times daily.   potassium chloride (KLOR-CON) 10 MEQ tablet Take 10 mEq by mouth every evening.   pravastatin (PRAVACHOL) 80 MG tablet Take 80 mg by mouth daily.   spironolactone (ALDACTONE) 25 MG tablet Take 25 mg by mouth daily.    tobramycin (TOBREX) 0.3 % ophthalmic solution Place 1 drop into both eyes as needed.   torsemide (DEMADEX) 20 MG tablet Take 20 mg by mouth 2 (two) times daily.   vitamin B-12 (CYANOCOBALAMIN) 1000 MCG tablet Take 1,000 mcg by mouth daily.     Allergies:   Januvia [sitagliptin], Keflex  [cephalexin], Levetiracetam, Januvia [sitagliptin], Keflex [cephalexin], and Tape   Social History   Socioeconomic History   Marital status: Widowed    Spouse name: Not on file   Number of children: Not on file   Years of education: Not on file   Highest education level: Not on file  Occupational History   Not on file  Tobacco Use   Smoking status: Never   Smokeless tobacco: Never  Substance and Sexual Activity   Alcohol use: Yes   Drug use: No   Sexual activity: Not on file  Other Topics Concern   Not on file  Social History Narrative   Lives home alone   Right handed   Drinks 3-4 cups caffeine daily   Social Determinants of Health   Financial Resource Strain: Not on file  Food Insecurity: Not on file  Transportation Needs: Not on file  Physical Activity: Not on file  Stress: Not on file  Social Connections: Not on file     Family History: The patient's family history includes Congestive Heart Failure in her mother; Diabetes in her mother; Heart attack in her father; High blood pressure in her mother; Throat cancer in her father.  ROS:   Please see the history of present illness.    All other systems reviewed and are negative.  EKGs/Labs/Other Studies Reviewed:    The following studies were reviewed today:  Echo 06/13/20 IMPRESSIONS   1. Left ventricular ejection fraction, by estimation, is 60 to 65%. The  left ventricle has normal function. The left ventricle has no regional  wall motion abnormalities. Left ventricular diastolic parameters are  consistent with Grade I diastolic  dysfunction (impaired relaxation).   2. Right ventricular systolic function is mildly reduced. The right  ventricular size is normal. There is mildly elevated pulmonary artery  systolic pressure.   3. Left atrial size was mildly dilated.   4. The mitral valve is normal in structure. Mild mitral valve  regurgitation. Mild mitral stenosis. Moderate mitral annular  calcification.   5.  The aortic valve is normal in structure. Aortic valve regurgitation is  not visualized. There is a valve present in the aortic position. Procedure  Date: March 2011 with a #21 [facility] Ease bovine AVR. Echo findings  are consistent with normal  structure and function of the aortic valve prosthesis.   6. The inferior vena cava is normal in size with greater than 50%  respiratory variability, suggesting right atrial pressure of 3 mmHg.   EKG:  EKG is ordered today.  The ekg ordered today demonstrates sinus with 1st deg AV block HR  39  Recent Labs: 03/18/2020: ALT 22 03/19/2020: B Natriuretic Peptide 332.7; Hemoglobin 10.6; Platelets 269; TSH 1.002 10/24/2020: BUN 55; Creatinine, Ser 1.73; Potassium 4.5; Sodium 143  Recent Lipid Panel No results found for: CHOL, TRIG, HDL, CHOLHDL, VLDL, LDLCALC, LDLDIRECT   Risk Assessment/Calculations:    CHA2DS2-VASc Score = 4   This indicates a 4.8% annual risk of stroke. The patient's score is based upon: CHF History: 0 HTN History: 1 Diabetes History: 0 Stroke History: 0 Vascular Disease History: 0 Age Score: 2 Gender Score: 1     HAS-BLED score 4 Hypertension (uncontrolled): No Abnormal renal and liver function (Dialysis, transplant, Cr >2.26 mg/dL /Cirrhosis or Bilirubin >2x Normal or AST/ALT/AP >3x Normal) Yes  Stroke Yes  Bleeding Yes  Labile INR (Unstable/high INR) No  Elderly (>65) Yes  Drugs or alcohol (? 8 drinks/week, anti-plt or NSAID) No   Physical Exam:    VS:  BP 122/72   Pulse 85   Ht 5' (1.524 m)   Wt 147 lb 6.4 oz (66.9 kg)   SpO2 99%   BMI 28.79 kg/m     Wt Readings from Last 3 Encounters:  11/27/20 147 lb 6.4 oz (66.9 kg)  10/24/20 145 lb (65.8 kg)  09/25/20 140 lb (63.5 kg)     GEN:  elderly white female HEENT: Normal NECK: No JVD LYMPHATICS: No lymphadenopathy CARDIAC: RRR, no murmurs, rubs, gallops RESPIRATORY:  Clear to auscultation without rales, wheezing or rhonchi  ABDOMEN: Soft, non-tender,  non-distended MUSCULOSKELETAL:  No edema; No deformity  SKIN: Warm and dry NEUROLOGIC:  Alert and oriented x 3 PSYCHIATRIC:  Normal affect   ASSESSMENT:    1. Atypical atrial flutter (HCC)   2. Subarachnoid hemorrhage (HCC)    PLAN:    In order of problems listed above:  Atypical atrial flutter with history of subarachnoid hemorrhage: she is set up for a pre Watchman TEE on 12/5 with Dr. Audie Box. If her appendage is amendable for closure, she will have an implant date of 12/25/20.   I have seen [name] in the office today who is being considered for a Watchman left atrial appendage closure device. I believe they will benefit from this procedure given their history of atrial fibrillation, CHA2DS2-VASc score of 4 and unadjusted ischemic stroke rate of 4.8% per year. Unfortunately, the patient is not felt to be a long term anticoagulation candidate secondary to history of intracranial bleed. HASBLED score of 4 ( see above for breakdown of RFs). The patient's chart has been reviewed and I feel that they would be a candidate for short term oral anticoagulation after Watchman implant.    It is my belief that after undergoing a LAA closure procedure, [name] will not need long term anticoagulation which eliminates anticoagulation side effects and major bleeding risk.    Procedural risks for the Watchman implant have been reviewed with the patient including a 0.5% risk of stroke, <1% risk of perforation and <1% risk of device embolization.   Shared Decision Making/Informed Consent The risks [esophageal damage, perforation (1:10,000 risk), bleeding, pharyngeal hematoma as well as other potential complications associated with conscious sedation including aspiration, arrhythmia, respiratory failure and death], benefits (treatment guidance and diagnostic support) and alternatives of a transesophageal echocardiogram were discussed in detail with Ms. Hazell and she is willing to proceed.     Medication  Adjustments/Labs and Tests Ordered: Current medicines are reviewed at length with the patient today.  Concerns regarding medicines are outlined above.  Orders Placed  This Encounter  Procedures   Basic metabolic panel   CBC   EKG 12-Lead    Meds ordered this encounter  Medications   apixaban (ELIQUIS) 2.5 MG TABS tablet    Sig: Take 1 tablet (2.5 mg total) by mouth 2 (two) times daily.    Dispense:  60 tablet    Refill:  4     Patient Instructions  Medication Instructions:  Start Eliquis 2.5 mg twice a day   *If you need a refill on your cardiac medications before your next appointment, please call your pharmacy*   Lab Work: Bmp, Cbc- Today   If you have labs (blood work) drawn today and your tests are completely normal, you will receive your results only by: [place] (if you have MyChart) OR A paper copy in the mail If you have any lab test that is abnormal or we need to change your treatment, we will call you to review the results.   Testing/Procedures: TEE scheduled on 12/15/20 @ 9:00 AM  Left Atrial Appendage Device Implantation on 12/25/20 @ 7:00 AM   Follow-Up: Someone from our structural team will contact you about follow up    Other Instructions None     Signed, Angelena Form, PA-C  11/27/2020 1:49 PM    [facility]

## 2020-11-26 NOTE — H&P (View-Only) (Signed)
[place]                                     Cardiology Office Note:    Date:  11/27/2020   ID:  [name], DOB [birth year], MRN [id number]  PCP:  Verdell Carmine., MD  [facility] Cardiologist:  Shirlee More, MD  [place] Electrophysiologist:  Vickie Epley, MD   Referring MD: Verdell Carmine., MD   CC: set up Watchman for pre LAAO planning.   History of Present Illness:    [name] is a 85 y.o. female with a hx of aortic valve replacement in March 2011, CHB s/p PPM (2020), hypertension, CKD stage IV, PVCs, paroxysmal atrial fibrillation and traumatic subarachnoid hemorrhage who presents to clinic to discuss pre Watchman TEE.  She has a history of traumatic subarachnoid hemorrhage.  She fell after passing out and struck her head.  She was determined to have suffered a small subarachnoid hemorrhage as a result of the fall.  She is followed by [facility] neurology.  She has a pacemaker that was implanted in April 2020 for complete heart block.  Per primary cardiologist, Dr. Bettina Gavia, okay to restart anticoagulation.  She was seen in the office by Dr. Quentin Ore on 10/24/20 for consideration of Watchman. She was set up for CT to assess cardiac anatomy but this was later switched to a TEE because of worsening in renal function with a creat of 1.7 and GFR of 29.  Today the patient presents to clinic for follow up. She is doing well today. Here alone. No CP or SOB. No LE edema, orthopnea or PND. No dizziness or syncope. No blood in stool or urine. No palpitations.     Past Medical History:  Diagnosis Date   Angina pectoris ([place]) 12/30/2014   Beat, premature ventricular 12/30/2014   CAD in native artery 12/30/2014   Cardiorenal disease 12/30/2014   Chronic diastolic heart failure ([place]) 12/30/2014   CKD (chronic kidney disease) stage 3, GFR 30-59 ml/min ([place]) 12/30/2014   Diabetes ([place])    Dyslipidemia 03/20/2018   H/O  aortic valve replacement with tissue graft 12/30/2014   Overview:  S/P AVR with tissue valve, March 2011, #21 [facility] Ease bovine and CABG   High blood pressure    High cholesterol    Incarcerated incisional hernia 05/14/2015   Type 2 diabetes mellitus with peripheral angiopathy ([place]) 04/24/2015    Past Surgical History:  Procedure Laterality Date   ABDOMINAL HERNIA REPAIR  2017   CARDIAC VALVE REPLACEMENT     CATARACT EXTRACTION W/ INTRAOCULAR LENS IMPLANT     CHOLECYSTECTOMY      Current Medications: Current Meds  Medication Sig   allopurinol (ZYLOPRIM) 100 MG tablet Take 100 mg by mouth 2 (two) times daily.   apixaban (ELIQUIS) 2.5 MG TABS tablet Take 1 tablet (2.5 mg total) by mouth 2 (two) times daily.   aspirin EC 81 MG tablet Take 81 mg by mouth daily.   cholecalciferol (VITAMIN D3) 25 MCG (1000 UNIT) tablet Take 1,000 Units by mouth daily.   EPIPEN 2-PAK 0.3 MG/0.3ML SOAJ injection INJECT INTRAMUSCULARLY AS DIRECTED   ferrous sulfate 325 (65 FE) MG tablet Take 325 mg by mouth daily.    losartan (COZAAR) 25 MG tablet Take 0.5 tablets by mouth daily.   magnesium oxide (MAG-OX) 400 MG tablet  Take 400 mg by mouth daily.   Multiple Vitamins-Minerals (MULTIVITAMIN ADULTS) TABS Take 1 tablet by mouth daily.   nitroGLYCERIN (NITROSTAT) 0.4 MG SL tablet Place 0.4 mg under the tongue as needed for chest pain.   Omega-3 1000 MG CAPS Take 1 tablet by mouth 2 (two) times daily.   potassium chloride (KLOR-CON) 10 MEQ tablet Take 10 mEq by mouth every evening.   pravastatin (PRAVACHOL) 80 MG tablet Take 80 mg by mouth daily.   spironolactone (ALDACTONE) 25 MG tablet Take 25 mg by mouth daily.    tobramycin (TOBREX) 0.3 % ophthalmic solution Place 1 drop into both eyes as needed.   torsemide (DEMADEX) 20 MG tablet Take 20 mg by mouth 2 (two) times daily.   vitamin B-12 (CYANOCOBALAMIN) 1000 MCG tablet Take 1,000 mcg by mouth daily.     Allergies:   Januvia [sitagliptin], Keflex  [cephalexin], Levetiracetam, Januvia [sitagliptin], Keflex [cephalexin], and Tape   Social History   Socioeconomic History   Marital status: Widowed    Spouse name: Not on file   Number of children: Not on file   Years of education: Not on file   Highest education level: Not on file  Occupational History   Not on file  Tobacco Use   Smoking status: Never   Smokeless tobacco: Never  Substance and Sexual Activity   Alcohol use: Yes   Drug use: No   Sexual activity: Not on file  Other Topics Concern   Not on file  Social History Narrative   Lives home alone   Right handed   Drinks 3-4 cups caffeine daily   Social Determinants of Health   Financial Resource Strain: Not on file  Food Insecurity: Not on file  Transportation Needs: Not on file  Physical Activity: Not on file  Stress: Not on file  Social Connections: Not on file     Family History: The patient's family history includes Congestive Heart Failure in her mother; Diabetes in her mother; Heart attack in her father; High blood pressure in her mother; Throat cancer in her father.  ROS:   Please see the history of present illness.    All other systems reviewed and are negative.  EKGs/Labs/Other Studies Reviewed:    The following studies were reviewed today:  Echo 06/13/20 IMPRESSIONS   1. Left ventricular ejection fraction, by estimation, is 60 to 65%. The  left ventricle has normal function. The left ventricle has no regional  wall motion abnormalities. Left ventricular diastolic parameters are  consistent with Grade I diastolic  dysfunction (impaired relaxation).   2. Right ventricular systolic function is mildly reduced. The right  ventricular size is normal. There is mildly elevated pulmonary artery  systolic pressure.   3. Left atrial size was mildly dilated.   4. The mitral valve is normal in structure. Mild mitral valve  regurgitation. Mild mitral stenosis. Moderate mitral annular  calcification.   5.  The aortic valve is normal in structure. Aortic valve regurgitation is  not visualized. There is a valve present in the aortic position. Procedure  Date: March 2011 with a #21 [facility] Ease bovine AVR. Echo findings  are consistent with normal  structure and function of the aortic valve prosthesis.   6. The inferior vena cava is normal in size with greater than 50%  respiratory variability, suggesting right atrial pressure of 3 mmHg.   EKG:  EKG is ordered today.  The ekg ordered today demonstrates sinus with 1st deg AV block HR  56  Recent Labs: 03/18/2020: ALT 22 03/19/2020: B Natriuretic Peptide 332.7; Hemoglobin 10.6; Platelets 269; TSH 1.002 10/24/2020: BUN 55; Creatinine, Ser 1.73; Potassium 4.5; Sodium 143  Recent Lipid Panel No results found for: CHOL, TRIG, HDL, CHOLHDL, VLDL, LDLCALC, LDLDIRECT   Risk Assessment/Calculations:    CHA2DS2-VASc Score = 4   This indicates a 4.8% annual risk of stroke. The patient's score is based upon: CHF History: 0 HTN History: 1 Diabetes History: 0 Stroke History: 0 Vascular Disease History: 0 Age Score: 2 Gender Score: 1     HAS-BLED score 4 Hypertension (uncontrolled): No Abnormal renal and liver function (Dialysis, transplant, Cr >2.26 mg/dL /Cirrhosis or Bilirubin >2x Normal or AST/ALT/AP >3x Normal) Yes  Stroke Yes  Bleeding Yes  Labile INR (Unstable/high INR) No  Elderly (>65) Yes  Drugs or alcohol (? 8 drinks/week, anti-plt or NSAID) No   Physical Exam:    VS:  BP 122/72   Pulse 85   Ht 5' (1.524 m)   Wt 147 lb 6.4 oz (66.9 kg)   SpO2 99%   BMI 28.79 kg/m     Wt Readings from Last 3 Encounters:  11/27/20 147 lb 6.4 oz (66.9 kg)  10/24/20 145 lb (65.8 kg)  09/25/20 140 lb (63.5 kg)     GEN:  elderly white female HEENT: Normal NECK: No JVD LYMPHATICS: No lymphadenopathy CARDIAC: RRR, no murmurs, rubs, gallops RESPIRATORY:  Clear to auscultation without rales, wheezing or rhonchi  ABDOMEN: Soft, non-tender,  non-distended MUSCULOSKELETAL:  No edema; No deformity  SKIN: Warm and dry NEUROLOGIC:  Alert and oriented x 3 PSYCHIATRIC:  Normal affect   ASSESSMENT:    1. Atypical atrial flutter (HCC)   2. Subarachnoid hemorrhage (HCC)    PLAN:    In order of problems listed above:  Atypical atrial flutter with history of subarachnoid hemorrhage: she is set up for a pre Watchman TEE on 12/5 with Dr. Audie Box. If her appendage is amendable for closure, she will have an implant date of 12/25/20.   I have seen [name] in the office today who is being considered for a Watchman left atrial appendage closure device. I believe they will benefit from this procedure given their history of atrial fibrillation, CHA2DS2-VASc score of 4 and unadjusted ischemic stroke rate of 4.8% per year. Unfortunately, the patient is not felt to be a long term anticoagulation candidate secondary to history of intracranial bleed. HASBLED score of 4 ( see above for breakdown of RFs). The patient's chart has been reviewed and I feel that they would be a candidate for short term oral anticoagulation after Watchman implant.    It is my belief that after undergoing a LAA closure procedure, [name] will not need long term anticoagulation which eliminates anticoagulation side effects and major bleeding risk.    Procedural risks for the Watchman implant have been reviewed with the patient including a 0.5% risk of stroke, <1% risk of perforation and <1% risk of device embolization.   Shared Decision Making/Informed Consent The risks [esophageal damage, perforation (1:10,000 risk), bleeding, pharyngeal hematoma as well as other potential complications associated with conscious sedation including aspiration, arrhythmia, respiratory failure and death], benefits (treatment guidance and diagnostic support) and alternatives of a transesophageal echocardiogram were discussed in detail with Ms. Hoes and she is willing to proceed.     Medication  Adjustments/Labs and Tests Ordered: Current medicines are reviewed at length with the patient today.  Concerns regarding medicines are outlined above.  Orders Placed  This Encounter  Procedures   Basic metabolic panel   CBC   EKG 12-Lead    Meds ordered this encounter  Medications   apixaban (ELIQUIS) 2.5 MG TABS tablet    Sig: Take 1 tablet (2.5 mg total) by mouth 2 (two) times daily.    Dispense:  60 tablet    Refill:  4     Patient Instructions  Medication Instructions:  Start Eliquis 2.5 mg twice a day   *If you need a refill on your cardiac medications before your next appointment, please call your pharmacy*   Lab Work: Bmp, Cbc- Today   If you have labs (blood work) drawn today and your tests are completely normal, you will receive your results only by: [place] (if you have MyChart) OR A paper copy in the mail If you have any lab test that is abnormal or we need to change your treatment, we will call you to review the results.   Testing/Procedures: TEE scheduled on 12/15/20 @ 9:00 AM  Left Atrial Appendage Device Implantation on 12/25/20 @ 7:00 AM   Follow-Up: Someone from our structural team will contact you about follow up    Other Instructions None     Signed, Angelena Form, PA-C  11/27/2020 1:49 PM    [facility]

## 2020-11-27 ENCOUNTER — Ambulatory Visit: Payer: Medicare Other | Admitting: Physician Assistant

## 2020-11-27 ENCOUNTER — Other Ambulatory Visit: Payer: Self-pay

## 2020-11-27 ENCOUNTER — Encounter: Payer: Self-pay | Admitting: Physician Assistant

## 2020-11-27 VITALS — BP 122/72 | HR 85 | Ht 60.0 in | Wt 147.4 lb

## 2020-11-27 DIAGNOSIS — I609 Nontraumatic subarachnoid hemorrhage, unspecified: Secondary | ICD-10-CM

## 2020-11-27 DIAGNOSIS — I484 Atypical atrial flutter: Secondary | ICD-10-CM | POA: Diagnosis not present

## 2020-11-27 MED ORDER — APIXABAN 2.5 MG PO TABS: 2.5000 mg | ORAL_TABLET | Freq: Two times a day (BID) | ORAL | 4 refills | Status: DC

## 2020-11-27 NOTE — Patient Instructions (Addendum)
Medication Instructions:  Start Eliquis 2.5 mg twice a day   *If you need a refill on your cardiac medications before your next appointment, please call your pharmacy*   Lab Work: Bmp, Cbc- Today   If you have labs (blood work) drawn today and your tests are completely normal, you will receive your results only by: [place] (if you have MyChart) OR A paper copy in the mail If you have any lab test that is abnormal or we need to change your treatment, we will call you to review the results.   Testing/Procedures: TEE scheduled on 12/15/20 @ 9:00 AM  Left Atrial Appendage Device Implantation on 12/25/20 @ 7:00 AM   Follow-Up: Someone from our structural team will contact you about follow up    Other Instructions None

## 2020-11-28 LAB — BASIC METABOLIC PANEL
BUN/Creatinine Ratio: 34 — ABNORMAL HIGH (ref 12–28)
BUN: 51 mg/dL — ABNORMAL HIGH (ref 8–27)
CO2: 28 mmol/L (ref 20–29)
Calcium: 9.8 mg/dL (ref 8.7–10.3)
Chloride: 101 mmol/L (ref 96–106)
Creatinine, Ser: 1.48 mg/dL — ABNORMAL HIGH (ref 0.57–1.00)
Glucose: 97 mg/dL (ref 70–99)
Potassium: 4.7 mmol/L (ref 3.5–5.2)
Sodium: 143 mmol/L (ref 134–144)
eGFR: 35 mL/min/{1.73_m2} — ABNORMAL LOW (ref >59)

## 2020-11-28 LAB — CBC
Hematocrit: 36.9 % (ref 34.0–46.6)
Hemoglobin: 12.1 g/dL (ref 11.1–15.9)
MCH: 28.6 pg (ref 26.6–33.0)
MCHC: 32.8 g/dL (ref 31.5–35.7)
MCV: 87 fL (ref 79–97)
Platelets: 227 10*3/uL (ref 150–450)
RBC: 4.23 x10E6/uL (ref 3.77–5.28)
RDW: 14.2 % (ref 11.7–15.4)
WBC: 8.5 10*3/uL (ref 3.4–10.8)

## 2020-12-01 ENCOUNTER — Encounter (HOSPITAL_COMMUNITY): Payer: Self-pay | Admitting: Cardiovascular Disease

## 2020-12-02 NOTE — Progress Notes (Signed)
Attempted to obtain medical history via telephone, unable to reach at this time. Unable to leave voicemail to return pre surgical testing department's phone call.   ?

## 2020-12-15 ENCOUNTER — Ambulatory Visit (HOSPITAL_COMMUNITY)
Admission: RE | Admit: 2020-12-15 | Discharge: 2020-12-15 | Disposition: A | Discharge status: Home / Self Care | Payer: Medicare Other | Source: Ambulatory Visit | Attending: Cardiovascular Disease | Admitting: Cardiovascular Disease

## 2020-12-15 ENCOUNTER — Ambulatory Visit (HOSPITAL_COMMUNITY): Payer: Medicare Other | Admitting: Certified Registered Nurse Anesthetist

## 2020-12-15 ENCOUNTER — Ambulatory Visit (HOSPITAL_COMMUNITY)
Admission: RE | Admit: 2020-12-15 | Discharge: 2020-12-15 | Disposition: A | Discharge status: Home / Self Care | Payer: Medicare Other | Source: Ambulatory Visit | Attending: Cardiology | Admitting: Cardiology

## 2020-12-15 ENCOUNTER — Telehealth: Payer: Self-pay

## 2020-12-15 ENCOUNTER — Encounter (HOSPITAL_COMMUNITY)
Admission: RE | Disposition: A | Discharge status: Home / Self Care | Payer: Medicare Other | Source: Ambulatory Visit | Attending: Cardiovascular Disease

## 2020-12-15 ENCOUNTER — Encounter (HOSPITAL_COMMUNITY): Payer: Self-pay | Admitting: Cardiovascular Disease

## 2020-12-15 ENCOUNTER — Other Ambulatory Visit: Payer: Self-pay

## 2020-12-15 DIAGNOSIS — I480 Paroxysmal atrial fibrillation: Secondary | ICD-10-CM | POA: Diagnosis present

## 2020-12-15 DIAGNOSIS — N184 Chronic kidney disease, stage 4 (severe): Secondary | ICD-10-CM | POA: Diagnosis not present

## 2020-12-15 DIAGNOSIS — I484 Atypical atrial flutter: Secondary | ICD-10-CM | POA: Diagnosis not present

## 2020-12-15 DIAGNOSIS — I442 Atrioventricular block, complete: Secondary | ICD-10-CM | POA: Insufficient documentation

## 2020-12-15 DIAGNOSIS — I609 Nontraumatic subarachnoid hemorrhage, unspecified: Secondary | ICD-10-CM | POA: Insufficient documentation

## 2020-12-15 DIAGNOSIS — I132 Hypertensive heart and chronic kidney disease with heart failure and with stage 5 chronic kidney disease, or end stage renal disease: Secondary | ICD-10-CM | POA: Insufficient documentation

## 2020-12-15 DIAGNOSIS — E1122 Type 2 diabetes mellitus with diabetic chronic kidney disease: Secondary | ICD-10-CM | POA: Diagnosis not present

## 2020-12-15 DIAGNOSIS — Z539 Procedure and treatment not carried out, unspecified reason: Secondary | ICD-10-CM | POA: Insufficient documentation

## 2020-12-15 DIAGNOSIS — I5032 Chronic diastolic (congestive) heart failure: Secondary | ICD-10-CM | POA: Insufficient documentation

## 2020-12-15 DIAGNOSIS — Z952 Presence of prosthetic heart valve: Secondary | ICD-10-CM | POA: Insufficient documentation

## 2020-12-15 DIAGNOSIS — K229 Disease of esophagus, unspecified: Secondary | ICD-10-CM

## 2020-12-15 DIAGNOSIS — Z950 Presence of cardiac pacemaker: Secondary | ICD-10-CM | POA: Insufficient documentation

## 2020-12-15 DIAGNOSIS — J392 Other diseases of pharynx: Secondary | ICD-10-CM

## 2020-12-15 LAB — GLUCOSE, CAPILLARY: Glucose-Capillary: 124 mg/dL — ABNORMAL HIGH (ref 70–99)

## 2020-12-15 SURGERY — INVASIVE LAB ABORTED CASE
Anesthesia: General

## 2020-12-15 MED ORDER — SODIUM CHLORIDE 0.9 % IV SOLN: INTRAVENOUS | Status: DC | PRN

## 2020-12-15 MED ORDER — SODIUM CHLORIDE 0.9 % IV SOLN: INTRAVENOUS | Status: DC

## 2020-12-15 MED ORDER — LIDOCAINE 2% (20 MG/ML) 5 ML SYRINGE
INTRAMUSCULAR | Status: DC | PRN
  Administered 2020-12-15: 40 mg via INTRAVENOUS

## 2020-12-15 MED ORDER — PROPOFOL 10 MG/ML IV BOLUS
INTRAVENOUS | Status: DC | PRN
  Administered 2020-12-15: 30 mg via INTRAVENOUS

## 2020-12-15 MED ORDER — PROPOFOL 500 MG/50ML IV EMUL
INTRAVENOUS | Status: DC | PRN
  Administered 2020-12-15: 75 ug/kg/min via INTRAVENOUS

## 2020-12-15 NOTE — Telephone Encounter (Signed)
Per Dr. Kathalene Frames TEE procedure report today, "Procedure canceled due to inability to pass the TEE probe.  We attempted to pass the TEE probe twice.  On the first occasion she had severe desaturations with resistance in the oropharynx.  That attempt was abandoned due to severe hypoxemia down to 60 to 80%.  After saturations returned to 100% with nonrebreather we did attempt again to pass the transesophageal probe.  However she again began to have profound desaturations with resistance in the oropharynx.  At that time given resistance and desaturations when this occurred we decided to abandon the procedure.  Would recommend she undergo an upper GI evaluation to determine if there is any pathology to suggest this resistance.  On my interview during her preprocedure evaluation and she informed she had no difficulty swallowing and no bleeding in the stomach.  Regardless she will need evaluation before we attempted TEE is pursued again."  Discussed with Nell Range. Cancelled LAAO procedure for next week and will place GI referral.  Left message for patient to call back to discuss.

## 2020-12-15 NOTE — Anesthesia Procedure Notes (Signed)
Procedure Name: MAC Date/Time: 12/15/2020 9:49 AM Performed by: Reece Agar, CRNA Pre-anesthesia Checklist: Patient identified, Emergency Drugs available, Suction available and Patient being monitored Patient Re-evaluated:Patient Re-evaluated prior to induction Oxygen Delivery Method: Nasal cannula

## 2020-12-15 NOTE — Discharge Instructions (Signed)

## 2020-12-15 NOTE — Anesthesia Preprocedure Evaluation (Addendum)
Anesthesia Evaluation  Patient identified by MRN, date of birth, ID band Patient awake    Reviewed: Allergy & Precautions, NPO status , Patient's Chart, lab work & pertinent test results  Airway Mallampati: II  TM Distance: >3 FB     Dental   Pulmonary neg pulmonary ROS,    breath sounds clear to auscultation       Cardiovascular hypertension, + angina + CAD and + Peripheral Vascular Disease   Rhythm:Regular Rate:Normal     Neuro/Psych negative neurological ROS     GI/Hepatic negative GI ROS, Neg liver ROS,   Endo/Other  diabetes  Renal/GU Renal disease     Musculoskeletal   Abdominal   Peds  Hematology   Anesthesia Other Findings   Reproductive/Obstetrics                             Anesthesia Physical Anesthesia Plan  ASA: 3  Anesthesia Plan: General   Post-op Pain Management:    Induction: Intravenous  PONV Risk Score and Plan: 3 and Propofol infusion and Treatment may vary due to age or medical condition  Airway Management Planned: Nasal Cannula and Simple Face Mask  Additional Equipment:   Intra-op Plan:   Post-operative Plan:   Informed Consent: I have reviewed the patients History and Physical, chart, labs and discussed the procedure including the risks, benefits and alternatives for the proposed anesthesia with the patient or authorized representative who has indicated his/her understanding and acceptance.     Dental advisory given  Plan Discussed with: CRNA and Anesthesiologist  Anesthesia Plan Comments:         Anesthesia Quick Evaluation

## 2020-12-15 NOTE — Interval H&P Note (Signed)
History and Physical Interval Note:  12/15/2020 9:27 AM  [name]  has presented today for surgery, with the diagnosis of AFIB, PRE WATCHMAN IMPLANT.  The various methods of treatment have been discussed with the patient and family. After consideration of risks, benefits and other options for treatment, the patient has consented to  Procedure(s): TRANSESOPHAGEAL ECHOCARDIOGRAM (TEE) (N/A) as a surgical intervention.  The patient's history has been reviewed, patient examined, no change in status, stable for surgery.  I have reviewed the patient's chart and labs.  Questions were answered to the patient's satisfaction.     NPO for TEE for pre watchman sizing. Denies symptoms.  Shared Decision Making/Informed Consent The risks [esophageal damage, perforation (1:10,000 risk), bleeding, pharyngeal hematoma as well as other potential complications associated with conscious sedation including aspiration, arrhythmia, respiratory failure and death], benefits (treatment guidance and diagnostic support) and alternatives of a transesophageal echocardiogram were discussed in detail with [name] and she is willing to proceed.   Lake Bells T. Audie Box, MD, [facility]  [address], [place], [place] [address] [phone]  9:28 AM

## 2020-12-15 NOTE — Transfer of Care (Signed)
Immediate Anesthesia Transfer of Care Note  Patient: [name]  Procedure(s) Performed: CANCELLED PROCEDURE  Patient Location: Endoscopy Unit  Anesthesia Type:MAC  Level of Consciousness: awake and alert   Airway & Oxygen Therapy: Patient Spontanous Breathing and Patient connected to nasal cannula oxygen  Post-op Assessment: Report given to RN and Post -op Vital signs reviewed and stable  Post vital signs: Reviewed and stable  Last Vitals:  Vitals Value Taken Time  BP 129/49 12/15/20 1022  Temp    Pulse 78 12/15/20 1027  Resp 20 12/15/20 1027  SpO2 97 % 12/15/20 1027  Vitals shown include unvalidated device data.  Last Pain:  Vitals:   12/15/20 0927  TempSrc: Oral  PainSc: 0-No pain         Complications: No notable events documented.

## 2020-12-15 NOTE — CV Procedure (Signed)
Procedure canceled due to inability to pass the TEE probe.  We attempted to pass the TEE probe twice.  On the first occasion she had severe desaturations with resistance in the oropharynx.  That attempt was abandoned due to severe hypoxemia down to 60 to 80%.  After saturations returned to 100% with nonrebreather we did attempt again to pass the transesophageal probe.  However she again began to have profound desaturations with resistance in the oropharynx.  At that time given resistance and desaturations when this occurred we decided to abandon the procedure.  Would recommend she undergo an upper GI evaluation to determine if there is any pathology to suggest this resistance.  On my interview during her preprocedure evaluation and she informed she had no difficulty swallowing and no bleeding in the stomach.  Regardless she will need evaluation before we attempted TEE is pursued again.   Lake Bells T. Audie Box, MD, [place]  [address] [place], [place] [address] [phone]  10:06 AM

## 2020-12-16 NOTE — Anesthesia Postprocedure Evaluation (Signed)
Anesthesia Post Note  Patient: [name]  Procedure(s) Performed: CANCELLED PROCEDURE     Patient location during evaluation: Endoscopy Anesthesia Type: General Level of consciousness: awake Pain management: pain level controlled Vital Signs Assessment: post-procedure vital signs reviewed and stable Respiratory status: spontaneous breathing Cardiovascular status: stable Postop Assessment: no apparent nausea or vomiting Anesthetic complications: no   No notable events documented.  Last Vitals:  Vitals:   12/15/20 1029 12/15/20 1039  BP: (!) 132/45 (!) 131/91  Pulse: 75 70  Resp: 17 18  Temp:    SpO2: 97% 97%    Last Pain:  Vitals:   12/15/20 1039  TempSrc:   PainSc: 0-No pain                 Juanangel Soderholm

## 2020-12-16 NOTE — Telephone Encounter (Signed)
Left message to call back  

## 2020-12-17 ENCOUNTER — Encounter (HOSPITAL_COMMUNITY): Payer: Self-pay | Admitting: Cardiovascular Disease

## 2020-12-17 NOTE — Telephone Encounter (Signed)
Discussed with patient. She understands her LAAO procedure for next week is cancelled due to inability to pass TEE probe to assess cardiac anatomy. She is agreeable to GI evaluation. She was grateful for assistance.  GI referral placed.

## 2020-12-23 ENCOUNTER — Other Ambulatory Visit (HOSPITAL_COMMUNITY): Payer: Medicare Other

## 2020-12-24 ENCOUNTER — Encounter: Payer: Self-pay | Admitting: Gastroenterology

## 2020-12-24 NOTE — Telephone Encounter (Signed)
The patient is scheduled for evaluation with Dr. Havery Moros 02/10/21.

## 2020-12-25 ENCOUNTER — Inpatient Hospital Stay (HOSPITAL_COMMUNITY): Admit: 2020-12-25 | Payer: Medicare Other | Admitting: Cardiology

## 2020-12-25 ENCOUNTER — Encounter (HOSPITAL_COMMUNITY): Payer: Self-pay

## 2020-12-25 SURGERY — LEFT ATRIAL APPENDAGE OCCLUSION
Anesthesia: General

## 2021-01-19 ENCOUNTER — Other Ambulatory Visit: Payer: Self-pay | Admitting: Cardiology

## 2021-02-03 ENCOUNTER — Ambulatory Visit (INDEPENDENT_AMBULATORY_CARE_PROVIDER_SITE_OTHER): Payer: Medicare Other

## 2021-02-03 DIAGNOSIS — I442 Atrioventricular block, complete: Secondary | ICD-10-CM

## 2021-02-03 LAB — CUP PACEART REMOTE DEVICE CHECK
Battery Remaining Longevity: 139 mo
Battery Voltage: 3.07 V
Brady Statistic AP VP Percent: 39.5 %
Brady Statistic AP VS Percent: 0 %
Brady Statistic AS VP Percent: 60.47 %
Brady Statistic AS VS Percent: 0.02 %
Brady Statistic RA Percent Paced: 39.48 %
Brady Statistic RV Percent Paced: 99.98 %
Date Time Interrogation Session: 20230123215007
Implantable Lead Implant Date: 20220331
Implantable Lead Implant Date: 20220331
Implantable Lead Location: 753859
Implantable Lead Location: 753860
Implantable Lead Model: 5076
Implantable Lead Model: 5076
Implantable Pulse Generator Implant Date: 20220331
Lead Channel Impedance Value: 304 Ohm
Lead Channel Impedance Value: 380 Ohm
Lead Channel Impedance Value: 399 Ohm
Lead Channel Impedance Value: 551 Ohm
Lead Channel Pacing Threshold Amplitude: 0.5 V
Lead Channel Pacing Threshold Amplitude: 0.625 V
Lead Channel Pacing Threshold Pulse Width: 0.4 ms
Lead Channel Pacing Threshold Pulse Width: 0.4 ms
Lead Channel Sensing Intrinsic Amplitude: 2 mV
Lead Channel Sensing Intrinsic Amplitude: 2 mV
Lead Channel Sensing Intrinsic Amplitude: 9.875 mV
Lead Channel Sensing Intrinsic Amplitude: 9.875 mV
Lead Channel Setting Pacing Amplitude: 1.5 V
Lead Channel Setting Pacing Amplitude: 2 V
Lead Channel Setting Pacing Pulse Width: 0.4 ms
Lead Channel Setting Sensing Sensitivity: 1.2 mV

## 2021-02-04 DIAGNOSIS — M65311 Trigger thumb, right thumb: Secondary | ICD-10-CM | POA: Insufficient documentation

## 2021-02-04 HISTORY — DX: Trigger thumb, right thumb: M65.311

## 2021-02-10 ENCOUNTER — Encounter: Payer: Self-pay | Admitting: Gastroenterology

## 2021-02-10 ENCOUNTER — Ambulatory Visit: Payer: Medicare Other | Admitting: Gastroenterology

## 2021-02-10 VITALS — BP 110/70 | HR 76 | Ht 60.0 in | Wt 154.0 lb

## 2021-02-10 DIAGNOSIS — Z7901 Long term (current) use of anticoagulants: Secondary | ICD-10-CM | POA: Diagnosis not present

## 2021-02-10 DIAGNOSIS — D649 Anemia, unspecified: Secondary | ICD-10-CM

## 2021-02-10 DIAGNOSIS — R1310 Dysphagia, unspecified: Secondary | ICD-10-CM | POA: Diagnosis not present

## 2021-02-10 DIAGNOSIS — I480 Paroxysmal atrial fibrillation: Secondary | ICD-10-CM

## 2021-02-10 NOTE — Progress Notes (Signed)
HPI :  86 year old female with a history of aortic valve replacement in 2011, history of recurrent syncope secondary to complete heart block status post pacemaker in 2020, stage IV CKD, PVCs, paroxysmal A. fib with traumatic subarachnoid hemorrhage this past December, on Eliquis, referred here by Lattie Corns, PA, for dysphagia.  Patient has been followed by Dr. Quentin Ore and considering a watchman device placement.  She was not deemed to be a candidate for cardiac CT due to her GFR, so she was scheduled for a TEE to evaluate her anatomy prior to consideration for watchman device.  This was attempted by Dr. Davina Poke on December 5.  Essentially they were unable to pass the TEE probe.  A few attempts were made however there was resistance in the oropharynx and severe desaturation occurred with oxygen saturations down to 60%.  Procedure was aborted, returned her sats 200% with a nonrebreather, tried to pass the TEE probe again and would not get through the posterior pharynx and desaturations occurred again.  The procedure was aborted.  She was referred to our office for further evaluation.  She states for the past 6 months she does have occasional dysphagia to food.  She states food can catch in her upper esophagus and then can come back up/regurgitate.  This will happen perhaps 2-3 times per week.  She denies any specific triggers.  No odynophagia.  No reflux symptoms.  No nausea or vomiting.  No abdominal pains.  She denies any problems with her bowels.  No blood in her stools.  No weight loss.  She has had a tough last several months as outlined above.  Apparently before the she was found to have a heart block as cause for her syncope, she was having seizures.  She states she has been not had a seizure in over a year, she has not had any further syncope since she had a pacemaker placed.  She is not on any seizure medications.  She currently denies any cardiopulmonary symptoms.  No chest pain or  shortness of breath.  She does report she had a history of an iron deficiency anemia, on iron "her whole life".  She states she had a colonoscopy a few years ago in [facility], no records of that on file.  She has never had a prior EGD.  She does have a history of diverticulitis that she endorses, she had CT evidence of diverticulitis in March 2022.  She states she has had multiple episodes of diverticulitis in her lifetime and she has had colonoscopies after these episodes which did not show any other concerning pathology.  Again we do not have any records of that on file.   Echocardiogram 06/13/20 - EF 60-65%, grade I DD, bovine AVR   CT scan abdomen / pelvis 03/18/20 -  IMPRESSION: 1. A 6 cm posterior right hip subcutaneus soft tissue hematoma. 2. Distal descending and proximal sigmoid colon acute diverticulitis. Much less likely differential diagnosis includes trauma to the large bowel. Please note limited evaluation for intramural abscess formation on this noncontrast study. 3. Otherwise no other acute traumatic injury to the chest, abdomen, or pelvis with limited evaluation on this noncontrast study.   4. No acute fracture or traumatic malalignment of the thoracic or lumbar spine.   Other imaging findings of potential clinical significance:   1. Suggestion of a large third portion of the duodenum/proximal jejunum diverticula. 2. A 7 mm right upper pulmonary nodule. Comparison with prior cross-sectional imaging would be of value  to evaluate stability. Non-contrast chest CT at 6-12 months is recommended. If the nodule is stable at time of repeat CT, then future CT at 18-24 months (from today's scan) is considered optional for low-risk patients, but is recommended for high-risk patients. This recommendation follows the consensus statement: Guidelines for Management of Incidental Pulmonary Nodules Detected on CT Images: From the Fleischner Society 2017; Radiology 2017; 284:228-243. 3.  A 3.6 cm slightly hypodense lesion within the right thyroid gland. Comparison with prior ultrasound or cross-sectional imaging would be of value to evaluate for stability. Recommend thyroid US (ref: J Am Coll Radiol. 2015 Feb;12(2): 143-50). However, please note that in the setting of significant comorbidities or limited life expectancy, no follow-up recommended (ref: J Am Coll Radiol. 2015 Feb;12(2): 143-50). 4. Aortic Atherosclerosis (ICD10-I70.0) including severe mitral annular calcifications.      Past Medical History:  Diagnosis Date   Angina pectoris ([place]) 12/30/2014   Beat, premature ventricular 12/30/2014   CAD in native artery 12/30/2014   Cardiorenal disease 12/30/2014   Chronic diastolic heart failure ([place]) 12/30/2014   CKD (chronic kidney disease) stage 3, GFR 30-59 ml/min ([place]) 12/30/2014   Diabetes ([place])    Dyslipidemia 03/20/2018   H/O aortic valve replacement with tissue graft 12/30/2014   Overview:  S/P AVR with tissue valve, March 2011, #[facility] Ease bovine and CABG   High blood pressure    High cholesterol    Incarcerated incisional hernia 05/14/2015   Type 2 diabetes mellitus with peripheral angiopathy ([place]) 04/24/2015     Past Surgical History:  Procedure Laterality Date   ABDOMINAL HERNIA REPAIR  2017   CARDIAC VALVE REPLACEMENT     CATARACT EXTRACTION W/ INTRAOCULAR LENS IMPLANT     CHOLECYSTECTOMY     Family History  Problem Relation Age of Onset   High blood pressure Mother    Diabetes Mother    Congestive Heart Failure Mother    Throat cancer Father    Heart attack Father    Social History   Tobacco Use   Smoking status: Never   Smokeless tobacco: Never  Substance Use Topics   Alcohol use: Yes   Drug use: No   Current Outpatient Medications  Medication Sig Dispense Refill   allopurinol (ZYLOPRIM) 100 MG tablet Take 100 mg by mouth 2 (two) times daily.     apixaban (ELIQUIS) 2.5 MG TABS tablet Take 1 tablet (2.5 mg total) by  mouth 2 (two) times daily. 60 tablet 4   aspirin EC 81 MG tablet Take 81 mg by mouth daily.     cholecalciferol (VITAMIN D3) 25 MCG (1000 UNIT) tablet Take 1,000 Units by mouth daily.     colchicine 0.6 MG tablet Take 0.6 mg by mouth daily as needed (Gout).     EPIPEN 2-PAK 0.3 MG/0.3ML SOAJ injection Inject 0.3 mg into the muscle as needed for anaphylaxis.  0   ferrous sulfate 325 (65 FE) MG tablet Take 325 mg by mouth daily.      losartan (COZAAR) 25 MG tablet Take 12.5 mg by mouth daily.     MAGNESIUM OXIDE PO Take 30 mg by mouth daily.     metoprolol succinate (TOPROL-XL) 50 MG 24 hr tablet TAKE 1 TABLET BY MOUTH  DAILY WITH OR IMMEDIATELY  FOLLOWING A MEAL 90 tablet 3   Multiple Vitamins-Minerals (MULTIVITAMIN ADULTS) TABS Take 1 tablet by mouth daily.     nitroGLYCERIN (NITROSTAT) 0.4 MG SL tablet Place 0.4 mg under the tongue as needed  for chest pain.     Omega-3 1000 MG CAPS Take 1,000 mg by mouth 2 (two) times daily.     potassium chloride (KLOR-CON) 10 MEQ tablet Take 10 mEq by mouth every evening.     pravastatin (PRAVACHOL) 80 MG tablet Take 80 mg by mouth daily.     spironolactone (ALDACTONE) 25 MG tablet Take 12.5 mg by mouth daily.     tobramycin (TOBREX) 0.3 % ophthalmic solution Place 1 drop into both eyes See admin instructions. Take every 3 months before and after shot in eye     torsemide (DEMADEX) 10 MG tablet Take 10 mg by mouth daily.     torsemide (DEMADEX) 20 MG tablet Take 0.5 tablets (10 mg total) by mouth 2 (two) times daily. 90 tablet 3   vitamin B-12 (CYANOCOBALAMIN) 1000 MCG tablet Take 1,000 mcg by mouth daily.     No current facility-administered medications for this visit.   Allergies  Allergen Reactions   Januvia [Sitagliptin] Itching and Swelling   Keflex [Cephalexin] Itching and Swelling   Levetiracetam Other (See Comments)    Joint pain, drowsiness - "kepra"   Tape Rash     Review of Systems: All systems reviewed and negative except where noted  in HPI.   Lab Results  Component Value Date   WBC 8.5 11/27/2020   HGB 12.1 11/27/2020   HCT 36.9 11/27/2020   MCV 87 11/27/2020   PLT 227 11/27/2020    Lab Results  Component Value Date   CREATININE 1.48 (H) 11/27/2020   BUN 51 (H) 11/27/2020   NA 143 11/27/2020   K 4.7 11/27/2020   CL 101 11/27/2020   CO2 28 11/27/2020    Lab Results  Component Value Date   ALT 22 03/18/2020   AST 26 03/18/2020   ALKPHOS 50 03/18/2020   BILITOT 0.6 03/18/2020     Physical Exam: BP 110/70    Pulse 76    Ht 5' (1.524 m)    Wt 154 lb (69.9 kg)    BMI 30.08 kg/m  Constitutional: Pleasant,well-developed, female in no acute distress. HEENT: Normocephalic and atraumatic. Conjunctivae are normal. No scleral icterus. Neck supple.  Cardiovascular: Normal rate, regular rhythm.  Pulmonary/chest: Effort normal and breath sounds normal.  Abdominal: Soft, nondistended, nontender. There are no masses palpable.  Extremities: no edema Lymphadenopathy: No cervical adenopathy noted. Neurological: Alert and oriented to person place and time. Skin: Skin is warm and dry. No rashes noted. Psychiatric: Normal mood and affect. Behavior is normal.   ASSESSMENT AND PLAN: 86 year old female here for patient assessment of the following:  Dysphagia Anticoagulated Iron deficiency anemia History of atrial fibrillation  As above, intermittent dysphagia for 6 months.  Cardiology was unable to pass the TEE probe, this was associated with significant oxygen desaturation, they were unable to complete that exam.  Discussed dysphagia with her in general.  Normally would go straight to EGD to evaluate these symptoms however given her history, she is higher than average risk for anesthesia.  We will start with a barium swallow with tablet, ensure no Zenker's diverticulum etc initially.  Pending that result may consider EGD.  Given her history of hypoxemia with anesthesia in the past, this would need to be done with  anesthesia support in the hospital.  We will start with barium swallow for now, hopefully this can be done in the upcoming days and then we can discuss how to proceed from there.  She reports longstanding iron deficiency anemia, on  supplemental iron.  Her hemoglobin appears to be in the 11's at baseline and her iron studies have recently been okay in the setting of persistent mild anemia, perhaps due to CKD, I don't think iron deficiency is causing her anemia now.  We will get her colonoscopy report if possible, she states she has had multiple exams, and that her history of diverticulitis is not new.  Hopefully we can avoid colonoscopy moving forward.  She agrees with the plan, further recommendations pending results and her course.  Plan - Barium swallow with tablet - rule out zenker's, will await this result first - obtain colonoscopy reports from [facility] - possible EGD, will need to be done at the hospital if pursued, she is higher than average risk for anesthesia  Jolly Mango, MD [facility]  CC: Eileen Stanford, PA*

## 2021-02-10 NOTE — Patient Instructions (Addendum)
If you are age 84 or older, your body mass index should be between 23-30. Your Body mass index is 30.08 kg/m. If this is out of the aforementioned range listed, please consider follow up with your Primary Care Provider.  If you are age 72 or younger, your body mass index should be between 19-25. Your Body mass index is 30.08 kg/m. If this is out of the aformentioned range listed, please consider follow up with your Primary Care Provider.   ________________________________________________________  The [facility] providers would like to encourage you to use [facility] to communicate with providers for non-urgent requests or questions.  Due to long hold times on the telephone, sending your provider a message by [facility] may be a faster and more efficient way to get a response.  Please allow 48 business hours for a response.  Please remember that this is for non-urgent requests.  _______________________________________________________  Dennis Bast have been scheduled for a Barium Esophogram at [facility] (1st floor of the hospital) on Tuesday, 02-17-21 at 11:00 am. Please arrive 15 minutes prior to your appointment for registration. Make certain not to have anything to eat or drink 3 hours prior to your test. If you need to reschedule for any reason, please contact radiology at [phone] to do so. ______________________________ ____________________________________ A barium swallow is an examination that concentrates on views of the esophagus. This tends to be a double contrast exam (barium and two liquids which, when combined, create a gas to distend the wall of the oesophagus) or single contrast (non-ionic iodine based). The study is usually tailored to your symptoms so a good history is essential. Attention is paid during the study to the form, structure and configuration of the esophagus, looking for functional disorders (such as aspiration, dysphagia, achalasia, motility and reflux) EXAMINATION You  may be asked to change into a gown, depending on the type of swallow being performed. A radiologist and radiographer will perform the procedure. The radiologist will advise you of the type of contrast selected for your procedure and direct you during the exam. You will be asked to stand, sit or lie in several different positions and to hold a small amount of fluid in your mouth before being asked to swallow while the imaging is performed .In some instances you may be asked to swallow barium coated marshmallows to assess the motility of a solid food bolus. The exam can be recorded as a digital or video fluoroscopy procedure. POST PROCEDURE It will take 1-2 days for the barium to pass through your system. To facilitate this, it is important, unless otherwise directed, to increase your fluids for the next 24-48hrs and to resume your normal diet.  This test typically takes about 30 minutes to perform. ________________________________________________________________________  We will request your colonoscopy records.  Thank you for entrusting me with your care and for choosing [facility], Dr.  Cellar     _____________________________________________________________________________

## 2021-02-13 NOTE — Progress Notes (Signed)
Remote pacemaker transmission.   

## 2021-02-17 ENCOUNTER — Ambulatory Visit (HOSPITAL_COMMUNITY)
Admission: RE | Admit: 2021-02-17 | Discharge: 2021-02-17 | Disposition: A | Discharge status: Home / Self Care | Payer: Medicare Other | Source: Ambulatory Visit | Attending: Gastroenterology | Admitting: Gastroenterology

## 2021-02-17 ENCOUNTER — Other Ambulatory Visit: Payer: Self-pay

## 2021-02-17 DIAGNOSIS — Z7901 Long term (current) use of anticoagulants: Secondary | ICD-10-CM | POA: Insufficient documentation

## 2021-02-17 DIAGNOSIS — R1310 Dysphagia, unspecified: Secondary | ICD-10-CM | POA: Insufficient documentation

## 2021-02-17 DIAGNOSIS — I480 Paroxysmal atrial fibrillation: Secondary | ICD-10-CM | POA: Insufficient documentation

## 2021-02-17 DIAGNOSIS — D649 Anemia, unspecified: Secondary | ICD-10-CM | POA: Insufficient documentation

## 2021-02-19 ENCOUNTER — Telehealth: Payer: Self-pay

## 2021-02-19 NOTE — Telephone Encounter (Signed)
Discussed with Dr. Quentin Ore. He reviewed GI work-up and at this time it is safest to hold off on LAAO. The patient will be a candidate if TEE probe is not necessary in the future.  Left message for patient to call back.

## 2021-02-20 ENCOUNTER — Other Ambulatory Visit: Payer: Self-pay

## 2021-02-20 ENCOUNTER — Ambulatory Visit: Payer: Medicare Other | Admitting: Cardiology

## 2021-02-20 ENCOUNTER — Encounter: Payer: Self-pay | Admitting: Cardiology

## 2021-02-20 VITALS — BP 124/78 | HR 90 | Ht 60.0 in | Wt 153.0 lb

## 2021-02-20 DIAGNOSIS — Z7901 Long term (current) use of anticoagulants: Secondary | ICD-10-CM

## 2021-02-20 DIAGNOSIS — Z951 Presence of aortocoronary bypass graft: Secondary | ICD-10-CM

## 2021-02-20 DIAGNOSIS — I130 Hypertensive heart and chronic kidney disease with heart failure and stage 1 through stage 4 chronic kidney disease, or unspecified chronic kidney disease: Secondary | ICD-10-CM

## 2021-02-20 DIAGNOSIS — I5032 Chronic diastolic (congestive) heart failure: Secondary | ICD-10-CM

## 2021-02-20 DIAGNOSIS — I609 Nontraumatic subarachnoid hemorrhage, unspecified: Secondary | ICD-10-CM

## 2021-02-20 DIAGNOSIS — I484 Atypical atrial flutter: Secondary | ICD-10-CM

## 2021-02-20 DIAGNOSIS — E7800 Pure hypercholesterolemia, unspecified: Secondary | ICD-10-CM

## 2021-02-20 DIAGNOSIS — Z950 Presence of cardiac pacemaker: Secondary | ICD-10-CM

## 2021-02-20 DIAGNOSIS — Z954 Presence of other heart-valve replacement: Secondary | ICD-10-CM

## 2021-02-20 NOTE — Progress Notes (Addendum)
Cardiology Office Note:    Date:  02/20/2021   ID:  [name], DOB [birth year], MRN [id number]  PCP:  Verdell Carmine., MD  Cardiologist:  Shirlee More, MD    Referring MD: Verdell Carmine., MD    ASSESSMENT:    1. Chronic anticoagulation   2. Subarachnoid hemorrhage ([place])   3. Atypical atrial flutter ([place])   4. Hx of CABG   5. H/O aortic valve replacement with tissue graft   6. Cardiac pacemaker in situ   7. Hypertensive heart and chronic kidney disease with chronic diastolic congestive heart failure, unspecified CKD stage ([place])   8. High cholesterol    PLAN:    In order of problems listed above:  [name] is tolerating reduced dose anticoagulant age renal function and I would not advise her at this point to move ahead with a Watchman device.  There is no reason to take aspirin puts her at risk for bleeding and will discontinue.  She is reassured and does not need any further GI evaluation Stable atrial arrhythmia maintaining sinus rhythm tolerating her anticoagulant and stable pacemaker follow-up through our practice. Stable hypertensive heart and kidney disease no edema on her current loop diuretic continue torsemide and spironolactone and beta-blocker BP at target Continue her statin with CAD pravastatin LDL at target  Next appointment: 6 months   Medication Adjustments/Labs and Tests Ordered: Current medicines are reviewed at length with the patient today.  Concerns regarding medicines are outlined above.  No orders of the defined types were placed in this encounter.  No orders of the defined types were placed in this encounter.   Chief complaint: I am anxious about having the Watchman device   History of Present Illness:    [name] is a 86 y.o. female with a hx of CAD with surgical AVR in March 2011 hypertensive heart disease with heart failure and CKD frequent PVCs paroxysmal atrial fibrillation and a permanent pacemaker and she had a subarachnoid hemorrhage  being cared for by neurology [facility].  Echocardiogram in my office 06/13/2020 showed normal left ventricular size function EF 60 to 65% mild right ventricular dysfunction mild elevation of pulmonary artery pressure left atrium was mildly enlarged there is mild calcific aortic stenosis and AVR evaluation was normal.  She was last seen 07/18/2020. Compliance with diet, lifestyle and medications: Yes  [name] continues to do well she has made a full recovery from her intracranial hemorrhage lives independently and is having no cardiovascular problems with edema shortness of breath chest pain palpitation or syncope. She is taking both aspirin and Eliquis I told her to stop aspirin and reinforced the need to take Eliquis twice daily. There is a great deal of literature on safety of the anticoagulant in patients like her. Past Medical History:  Diagnosis Date   Angina pectoris ([place]) 12/30/2014   Beat, premature ventricular 12/30/2014   CAD in native artery 12/30/2014   Cardiorenal disease 12/30/2014   Chronic diastolic heart failure ([place]) 12/30/2014   CKD (chronic kidney disease) stage 3, GFR 30-59 ml/min ([place]) 12/30/2014   Diabetes ([place])    Dyslipidemia 03/20/2018   H/O aortic valve replacement with tissue graft 12/30/2014   Overview:  S/P AVR with tissue valve, March 2011, #21 [facility] Ease bovine and CABG   High blood pressure    High cholesterol    Incarcerated incisional hernia 05/14/2015   Type 2 diabetes mellitus with peripheral angiopathy ([place]) 04/24/2015    Past Surgical  History:  Procedure Laterality Date   ABDOMINAL HERNIA REPAIR  2017   CARDIAC VALVE REPLACEMENT     CATARACT EXTRACTION W/ INTRAOCULAR LENS IMPLANT     CHOLECYSTECTOMY      Current Medications: Current Meds  Medication Sig   allopurinol (ZYLOPRIM) 100 MG tablet Take 100 mg by mouth 2 (two) times daily.   apixaban (ELIQUIS) 2.5 MG TABS tablet Take 1 tablet (2.5 mg total) by mouth 2 (two) times daily.    cholecalciferol (VITAMIN D3) 25 MCG (1000 UNIT) tablet Take 1,000 Units by mouth daily.   colchicine 0.6 MG tablet Take 0.6 mg by mouth daily as needed (Gout).   EPIPEN 2-PAK 0.3 MG/0.3ML SOAJ injection Inject 0.3 mg into the muscle as needed for anaphylaxis.   ferrous sulfate 325 (65 FE) MG tablet Take 325 mg by mouth daily.    losartan (COZAAR) 25 MG tablet Take 12.5 mg by mouth daily.   MAGNESIUM OXIDE PO Take 30 mg by mouth daily.   metoprolol succinate (TOPROL-XL) 50 MG 24 hr tablet TAKE 1 TABLET BY MOUTH  DAILY WITH OR IMMEDIATELY  FOLLOWING A MEAL (Patient taking differently: Take 50 mg by mouth daily.)   Multiple Vitamins-Minerals (MULTIVITAMIN ADULTS) TABS Take 1 tablet by mouth daily.   nitroGLYCERIN (NITROSTAT) 0.4 MG SL tablet Place 0.4 mg under the tongue as needed for chest pain.   Omega-3 1000 MG CAPS Take 1,000 mg by mouth 2 (two) times daily.   potassium chloride (KLOR-CON) 10 MEQ tablet Take 10 mEq by mouth every evening.   pravastatin (PRAVACHOL) 80 MG tablet Take 80 mg by mouth daily.   spironolactone (ALDACTONE) 25 MG tablet Take 12.5 mg by mouth daily.   tobramycin (TOBREX) 0.3 % ophthalmic solution Place 1 drop into both eyes See admin instructions. Take every 3 months before and after shot in eye   torsemide (DEMADEX) 10 MG tablet Take 10 mg by mouth daily.   vitamin B-12 (CYANOCOBALAMIN) 1000 MCG tablet Take 1,000 mcg by mouth daily.   [DISCONTINUED] aspirin EC 81 MG tablet Take 81 mg by mouth daily.     Allergies:   Januvia [sitagliptin], Keflex [cephalexin], Levetiracetam, and Tape   Social History   Socioeconomic History   Marital status: Widowed    Spouse name: Not on file   Number of children: 1   Years of education: Not on file   Highest education level: Not on file  Occupational History   Not on file  Tobacco Use   Smoking status: Never   Smokeless tobacco: Never  Substance and Sexual Activity   Alcohol use: Yes   Drug use: No   Sexual activity:  Not Currently  Other Topics Concern   Not on file  Social History Narrative   Lives home alone   Right handed   Drinks 3-4 cups caffeine daily   Social Determinants of Health   Financial Resource Strain: Not on file  Food Insecurity: Not on file  Transportation Needs: Not on file  Physical Activity: Not on file  Stress: Not on file  Social Connections: Not on file     Family History: The patient's family history includes Congestive Heart Failure in her mother; Diabetes in her mother; Heart attack in her father; High blood pressure in her mother; Throat cancer in her father. ROS:   Please see the history of present illness.    All other systems reviewed and are negative.  EKGs/Labs/Other Studies Reviewed:    The following studies were reviewed  today:  Recent Labs: 03/18/2020: ALT 22 03/19/2020: B Natriuretic Peptide 332.7; TSH 1.002 11/27/2020: BUN 51; Creatinine, Ser 1.48; Hemoglobin 12.1; Platelets 227; Potassium 4.7; Sodium 143  02/04/2021: Cholesterol 139 triglycerides 97 HDL 48 LDL 84 CMP potassium 4.1 creatinine 1.28 GFR 42 cc Hemoglobin 11.5 with normocytic platelets 185,000 Physical Exam:    VS:  BP 124/78 (BP Location: Left Arm, Patient Position: Sitting)    Pulse 90    Ht 5' (1.524 m)    Wt 153 lb (69.4 kg)    SpO2 98%    BMI 29.88 kg/m     Wt Readings from Last 3 Encounters:  02/20/21 153 lb (69.4 kg)  02/10/21 154 lb (69.9 kg)  12/15/20 145 lb (65.8 kg)     GEN:  Well nourished, well developed in no acute distress HEENT: Normal NECK: No JVD; No carotid bruits LYMPHATICS: No lymphadenopathy CARDIAC: 1/6 ejection murmur no aortic regurgitation no findings of AVR dysfunction RRR, no murmurs, rubs, gallops RESPIRATORY:  Clear to auscultation without rales, wheezing or rhonchi  ABDOMEN: Soft, non-tender, non-distended MUSCULOSKELETAL:  No edema; No deformity  SKIN: Warm and dry NEUROLOGIC:  Alert and oriented x 3 PSYCHIATRIC:  Normal affect     Signed, Shirlee More, MD  02/20/2021 11:37 AM    [place]

## 2021-02-20 NOTE — Patient Instructions (Signed)
Medication Instructions:  Your physician has recommended you make the following change in your medication:  STOP: Aspirin *If you need a refill on your cardiac medications before your next appointment, please call your pharmacy*   Lab Work: None If you have labs (blood work) drawn today and your tests are completely normal, you will receive your results only by: [place] (if you have MyChart) OR A paper copy in the mail If you have any lab test that is abnormal or we need to change your treatment, we will call you to review the results.   Testing/Procedures: None   Follow-Up: At [facility], you and your health needs are our priority.  As part of our continuing mission to provide you with exceptional heart care, we have created designated Provider Care Teams.  These Care Teams include your primary Cardiologist (physician) and Advanced Practice Providers (APPs -  Physician Assistants and Nurse Practitioners) who all work together to provide you with the care you need, when you need it.  We recommend signing up for the patient portal called "MyChart".  Sign up information is provided on this After Visit Summary.  MyChart is used to connect with patients for Virtual Visits (Telemedicine).  Patients are able to view lab/test results, encounter notes, upcoming appointments, etc.  Non-urgent messages can be sent to your provider as well.   To learn more about what you can do with MyChart, go to [email or web address].    Your next appointment:   6 month(s)  The format for your next appointment:   In Person  Provider:   Shirlee More, MD    Other Instructions

## 2021-02-24 ENCOUNTER — Telehealth: Payer: Self-pay | Admitting: Gastroenterology

## 2021-02-24 NOTE — Telephone Encounter (Signed)
Patient returned call. Advised about EGD procedure. Seem a litle confused states she thought it was called off.

## 2021-02-24 NOTE — Telephone Encounter (Signed)
Thank you for letting me know, this is helpful information.  Jan can you let the patient know that we do not need to pursue EGD purely for making sure the TEE probe can pass, it would be to evaluate the abnormality noted on her barium swallow.  There are some nonspecific changes in her esophagus on that exam, EGD would definitely be needed to clarify the findings but she is higher than average risk for anesthesia.  Her case would need to be done in the hospital.  I think we have tentatively scheduled this for her in April.  I would like to see her back in the clinic to discuss this further to see if she really wants to pursue this or not.  If she strongly wishes to proceed with this then keep her on the schedule, she will need approval to hold Eliquis for 2 days.  Thanks

## 2021-02-24 NOTE — Telephone Encounter (Signed)
Spoke with the patient in detail regarding TEE probe and Watchman. She spoke with her cardiologist and they agreed together not to proceed at this time. Informed her that she will be called if Watchman is to be done with 4D echo instead of TEE.  She was grateful for call and agrees with plan.

## 2021-02-24 NOTE — Telephone Encounter (Signed)
Called and LM for patient to call back to discuss.

## 2021-03-02 NOTE — Telephone Encounter (Signed)
Left detailed Message for patient to call back to discuss if she would like to proceed with EGD at hospital.

## 2021-03-04 NOTE — Telephone Encounter (Signed)
Patient called back. She indicates that she does NOT want to proceed with EGD at this time. She discussed with her Cardiologist and was advised not to have the procedure. Patient assured me that she would call back if she changed her mind or would like an office visit to discuss further.

## 2021-04-21 ENCOUNTER — Encounter: Payer: Self-pay | Admitting: *Deleted

## 2021-04-22 ENCOUNTER — Ambulatory Visit: Payer: Medicare Other | Admitting: Neurology

## 2021-04-22 ENCOUNTER — Encounter: Payer: Self-pay | Admitting: Neurology

## 2021-04-22 VITALS — BP 148/76 | HR 72 | Ht 60.0 in | Wt 150.8 lb

## 2021-04-22 DIAGNOSIS — G4719 Other hypersomnia: Secondary | ICD-10-CM | POA: Diagnosis not present

## 2021-04-22 DIAGNOSIS — Z952 Presence of prosthetic heart valve: Secondary | ICD-10-CM

## 2021-04-22 DIAGNOSIS — G40909 Epilepsy, unspecified, not intractable, without status epilepticus: Secondary | ICD-10-CM | POA: Diagnosis not present

## 2021-04-22 DIAGNOSIS — E663 Overweight: Secondary | ICD-10-CM

## 2021-04-22 DIAGNOSIS — I4892 Unspecified atrial flutter: Secondary | ICD-10-CM

## 2021-04-22 DIAGNOSIS — G4733 Obstructive sleep apnea (adult) (pediatric): Secondary | ICD-10-CM

## 2021-04-22 DIAGNOSIS — I609 Nontraumatic subarachnoid hemorrhage, unspecified: Secondary | ICD-10-CM

## 2021-04-22 NOTE — Progress Notes (Signed)
Subjective:  ?  ?Patient ID: [name] is a 86 y.o. female. ? ?HPI ? ? ? ?Star Age, MD, PhD ?[facility] ?Home, [address] ?[phone] ?[place], [place] [address] ? ?Dear Dr. Harrell Lark,  ? ?I saw your patient, [name], upon your kind request in my sleep clinic today for initial consultation of her sleep disorder, in particular, evaluation of her prior diagnosis of obstructive sleep apnea.  The patient is unaccompanied today.  As you know, [name] is an 86 year old right-handed woman with an underlying medical history of coronary artery disease, chronic diastolic congestive heart failure, chronic kidney disease, diabetes, hyperlipidemia, history of aortic valve replacement, diabetes, history of subarachnoid hemorrhage, history of seizure disorder (previously followed by Dr. Doy Hutching and recently, my colleague, Dr. April Manson), and overweight state, who was previously diagnosed with obstructive sleep apnea and placed on PAP treatment.  I reviewed your office note from 02/04/21. Prior sleep study results are not available for my review today.  [facility] was [facility] about 7 years ago.  She has not used a CPAP in about 5 years, she does not know what happened to her machine, may have been thrown out when she was downsizing.  She would be willing to get retested and reestablish treatment with a CPAP machine.  As she recalls, she was told she had severe sleep apnea.  Her Epworth sleepiness score is 13 out of 24, fatigue severity score is 41 out of 63.  She goes to bed around 1 or 2 AM and rise time is around 10 AM, she usually wakes up around 6 or 7 AM but may not get out of bed and may doze off again until about 10.  She has nocturia once around 3 AM.  She denies recurrent morning headaches.  She is currently not on any seizure medications.  She had a 24-hour recently.  She is a retired Optometrist, is widowed, has 1 grown daughter, has 3 cats in the household.  She does not have a TV in her  bedroom.  She was considered for Watchman procedure but could not have it done.  She had a bovine aortic valve replacement around 2010.  She has a pacemaker.  She has reviewed history of sleep apnea.  She drinks caffeine in the form of tea, 1 cup in the morning, 1 soda per day on average.  She is a non-smoker and drinks alcohol occasionally.   ? ?Her Past Medical History Is Significant For: ?Past Medical History:  ?Diagnosis Date  ? Angina pectoris ([place]) 12/30/2014  ? Beat, premature ventricular 12/30/2014  ? CAD in native artery 12/30/2014  ? Cardiorenal disease 12/30/2014  ? Chronic diastolic heart failure ([place]) 12/30/2014  ? CKD (chronic kidney disease) stage 3, GFR 30-59 ml/min ([facility]) 12/30/2014  ? Diabetes ([place])   ? Dyslipidemia 03/20/2018  ? H/O aortic valve replacement with tissue graft 12/30/2014  ? Overview:  S/P AVR with tissue valve, March 2011, #21 [facility] Ease bovine and CABG  ? High blood pressure   ? High cholesterol   ? Incarcerated incisional hernia 05/14/2015  ? Type 2 diabetes mellitus with peripheral angiopathy ([place]) 04/24/2015  ? ? ?Her Past Surgical History Is Significant For: ?Past Surgical History:  ?Procedure Laterality Date  ? ABDOMINAL HERNIA REPAIR  2017  ? CARDIAC VALVE REPLACEMENT    ? CATARACT EXTRACTION W/ INTRAOCULAR LENS IMPLANT    ? CHOLECYSTECTOMY    ? ? ?Her Family History Is Significant  For: ?Family History  ?Problem Relation Age of Onset  ? High blood pressure Mother   ? Diabetes Mother   ? Congestive Heart Failure Mother   ? Throat cancer Father   ? Heart attack Father   ? Sleep apnea Neg Hx   ? ? ?Her Social History Is Significant For: ?Social History  ? ?Socioeconomic History  ? Marital status: Widowed  ?  Spouse name: Not on file  ? Number of children: 1  ? Years of education: Not on file  ? Highest education level: Not on file  ?Occupational History  ? Not on file  ?Tobacco Use  ? Smoking status: Never  ? Smokeless tobacco: Never  ?Substance and Sexual Activity  ? Alcohol  use: Yes  ?  Comment: OCC  ? Drug use: No  ? Sexual activity: Not Currently  ?Other Topics Concern  ? Not on file  ?Social History Narrative  ? Lives home alone  ? Right handed  ? Drinks 3-4 cups caffeine daily  ? ?Social Determinants of Health  ? ?Financial Resource Strain: Not on file  ?Food Insecurity: Not on file  ?Transportation Needs: Not on file  ?Physical Activity: Not on file  ?Stress: Not on file  ?Social Connections: Not on file  ? ? ?Her Allergies Are:  ?Allergies  ?Allergen Reactions  ? Januvia [Sitagliptin] Itching and Swelling  ? Keflex [Cephalexin] Itching and Swelling  ? Levetiracetam Other (See Comments)  ?  Joint pain, drowsiness - "kepra"  ? Tape Rash  ?:  ? ?Her Current Medications Are:  ?Outpatient Encounter Medications as of 04/22/2021  ?Medication Sig  ? allopurinol (ZYLOPRIM) 100 MG tablet Take 100 mg by mouth 2 (two) times daily.  ? apixaban (ELIQUIS) 2.5 MG TABS tablet Take 1 tablet (2.5 mg total) by mouth 2 (two) times daily.  ? cholecalciferol (VITAMIN D3) 25 MCG (1000 UNIT) tablet Take 1,000 Units by mouth daily.  ? colchicine 0.6 MG tablet Take 0.6 mg by mouth daily as needed (Gout).  ? EPIPEN 2-PAK 0.3 MG/0.3ML SOAJ injection Inject 0.3 mg into the muscle as needed for anaphylaxis.  ? ferrous sulfate 325 (65 FE) MG tablet Take 325 mg by mouth daily.   ? losartan (COZAAR) 25 MG tablet Take 12.5 mg by mouth daily.  ? MAGNESIUM OXIDE PO Take 30 mg by mouth daily.  ? metoprolol succinate (TOPROL-XL) 50 MG 24 hr tablet TAKE 1 TABLET BY MOUTH  DAILY WITH OR IMMEDIATELY  FOLLOWING A MEAL (Patient taking differently: Take 50 mg by mouth daily.)  ? Multiple Vitamins-Minerals (MULTIVITAMIN ADULTS) TABS Take 1 tablet by mouth daily.  ? nitroGLYCERIN (NITROSTAT) 0.4 MG SL tablet Place 0.4 mg under the tongue as needed for chest pain.  ? Omega-3 1000 MG CAPS Take 1,000 mg by mouth 2 (two) times daily.  ? potassium chloride (KLOR-CON) 10 MEQ tablet Take 10 mEq by mouth every evening.  ? pravastatin  (PRAVACHOL) 80 MG tablet Take 80 mg by mouth daily.  ? spironolactone (ALDACTONE) 25 MG tablet Take 12.5 mg by mouth daily.  ? tobramycin (TOBREX) 0.3 % ophthalmic solution Place 1 drop into both eyes See admin instructions. Take every 3 months before and after shot in eye  ? torsemide (DEMADEX) 10 MG tablet Take 10 mg by mouth daily.  ? vitamin B-12 (CYANOCOBALAMIN) 1000 MCG tablet Take 1,000 mcg by mouth daily.  ? ?No facility-administered encounter medications on file as of 04/22/2021.  ?: ? ? ?Review of Systems:  ?Out of  a complete 14 point review of systems, all are reviewed and negative with the exception of these symptoms as listed below: ? ? ?Review of Systems  ?Neurological:   ?     Pt is here for sleep consult   pt states she has fatigue ,hypertension, snore. Pt denies headaches Pt states she had a sleep study 7 years ago and she hasnt used CPAP in 5 years. Pt no longer has CPAP machine  ? ?ESS:13 ?FSS:41  ? ?Objective:  ?Neurological Exam ? ?Physical Exam ?Physical Examination:  ? ?Vitals:  ? 04/22/21 1349  ?BP: (!) 148/76  ?Pulse: 72  ? ? ?General Examination: The patient is a very pleasant 86 y.o. female in no acute distress. She appears well-developed and well-nourished and well groomed.  ? ?HEENT: Normocephalic, atraumatic, pupils are equal, round and reactive to light, extraocular tracking is good without limitation to gaze excursion or nystagmus noted.  Corrective eyeglasses in place.  Hearing is grossly intact. Face is symmetric with normal facial animation. Speech is clear with no dysarthria noted. There is no hypophonia. There is no lip, neck/head, jaw or voice tremor. Neck is supple with full range of passive and active motion. There are no carotid bruits on auscultation. Oropharynx exam reveals: moderate mouth dryness, adequate dental hygiene and moderate airway crowding, due to redundant soft palate, thicker tongue, tonsils small, Mallampati class III, neck circumference of 17 inches.  She has  a mild overbite, slightly skewed teeth alignment.  ? ?Chest: Clear to auscultation without wheezing, rhonchi or crackles noted. ? ?Heart: S1+S2+0, regular and normal without murmurs, rubs or gallops note

## 2021-04-22 NOTE — Patient Instructions (Signed)
It was nice to meet you today!  ?Here is what we discussed today and what we came up with as our plan for you:  ?Based on your symptoms and your exam I believe you are still at risk for obstructive sleep apnea and would benefit from re-evaluation as it has been many years and you need a new machine. Therefore, I think we should proceed with a sleep study to determine how severe your sleep apnea is. If you have more than mild OSA, I want you to consider ongoing treatment with CPAP. Please remember, the risks and ramifications of moderate to severe obstructive sleep apnea or OSA are: Cardiovascular disease, including congestive heart failure, stroke, difficult to control hypertension, arrhythmias, and even type 2 diabetes has been linked to untreated OSA. Sleep apnea causes disruption of sleep and sleep deprivation in most cases, which, in turn, can cause recurrent headaches, problems with memory, mood, concentration, focus, and vigilance. Most people with untreated sleep apnea report excessive daytime sleepiness, which can affect their ability to drive. Please do not drive if you feel sleepy.  ? ?I will likely see you back after your sleep study to go over the test results and where to go from there. We will call you after your sleep study to advise about the results (most likely, you will hear from [place], my nurse) and to set up an appointment at the time, as necessary.   ? ?Our sleep lab administrative assistant will call you to schedule your sleep study. If you don't hear back from her by about 2 weeks from now, please feel free to call her at [phone]. You can leave a message with your phone number and concerns, if you get the voicemail box. She will call back as soon as possible.  ? ? ?

## 2021-04-23 ENCOUNTER — Telehealth: Payer: Self-pay

## 2021-04-23 NOTE — Telephone Encounter (Signed)
LVM for pt to call me back to schedule sleep study  

## 2021-05-03 ENCOUNTER — Ambulatory Visit (INDEPENDENT_AMBULATORY_CARE_PROVIDER_SITE_OTHER): Payer: Medicare Other | Admitting: Neurology

## 2021-05-03 DIAGNOSIS — G40909 Epilepsy, unspecified, not intractable, without status epilepticus: Secondary | ICD-10-CM

## 2021-05-03 DIAGNOSIS — I609 Nontraumatic subarachnoid hemorrhage, unspecified: Secondary | ICD-10-CM

## 2021-05-03 DIAGNOSIS — R0683 Snoring: Secondary | ICD-10-CM

## 2021-05-03 DIAGNOSIS — G4720 Circadian rhythm sleep disorder, unspecified type: Secondary | ICD-10-CM

## 2021-05-03 DIAGNOSIS — E663 Overweight: Secondary | ICD-10-CM

## 2021-05-03 DIAGNOSIS — G4733 Obstructive sleep apnea (adult) (pediatric): Secondary | ICD-10-CM | POA: Diagnosis not present

## 2021-05-03 DIAGNOSIS — G478 Other sleep disorders: Secondary | ICD-10-CM

## 2021-05-03 DIAGNOSIS — G4719 Other hypersomnia: Secondary | ICD-10-CM

## 2021-05-03 DIAGNOSIS — R9431 Abnormal electrocardiogram [ECG] [EKG]: Secondary | ICD-10-CM

## 2021-05-03 DIAGNOSIS — I4892 Unspecified atrial flutter: Secondary | ICD-10-CM

## 2021-05-05 ENCOUNTER — Ambulatory Visit (INDEPENDENT_AMBULATORY_CARE_PROVIDER_SITE_OTHER): Payer: Medicare Other

## 2021-05-05 DIAGNOSIS — I517 Cardiomegaly: Secondary | ICD-10-CM

## 2021-05-05 LAB — CUP PACEART REMOTE DEVICE CHECK
Battery Remaining Longevity: 137 mo
Battery Voltage: 3.04 V
Brady Statistic AP VP Percent: 32.2 %
Brady Statistic AP VS Percent: 0.01 %
Brady Statistic AS VP Percent: 67.51 %
Brady Statistic AS VS Percent: 0.29 %
Brady Statistic RA Percent Paced: 32.28 %
Brady Statistic RV Percent Paced: 99.7 %
Date Time Interrogation Session: 20230424055042
Implantable Lead Implant Date: 20220331
Implantable Lead Implant Date: 20220331
Implantable Lead Location: 753859
Implantable Lead Location: 753860
Implantable Lead Model: 5076
Implantable Lead Model: 5076
Implantable Pulse Generator Implant Date: 20220331
Lead Channel Impedance Value: 304 Ohm
Lead Channel Impedance Value: 399 Ohm
Lead Channel Impedance Value: 456 Ohm
Lead Channel Impedance Value: 551 Ohm
Lead Channel Pacing Threshold Amplitude: 0.5 V
Lead Channel Pacing Threshold Amplitude: 0.625 V
Lead Channel Pacing Threshold Pulse Width: 0.4 ms
Lead Channel Pacing Threshold Pulse Width: 0.4 ms
Lead Channel Sensing Intrinsic Amplitude: 2.25 mV
Lead Channel Sensing Intrinsic Amplitude: 2.25 mV
Lead Channel Sensing Intrinsic Amplitude: 9.75 mV
Lead Channel Sensing Intrinsic Amplitude: 9.75 mV
Lead Channel Setting Pacing Amplitude: 1.5 V
Lead Channel Setting Pacing Amplitude: 2 V
Lead Channel Setting Pacing Pulse Width: 0.4 ms
Lead Channel Setting Sensing Sensitivity: 1.2 mV

## 2021-05-08 NOTE — Procedures (Signed)
PATIENT'S NAME:  [name], [name] ?DOB:      [birth year]      ?MR#:    [id number]     ?DATE OF RECORDING: 05/03/2021 ?REFERRING M.D.:  Verdell Carmine, MD ?Study Performed:   Baseline Polysomnogram ?HISTORY: 86 year old right-handed woman with an underlying medical history of coronary artery disease, chronic diastolic congestive heart failure, chronic kidney disease, diabetes, hyperlipidemia, history of aortic valve replacement, diabetes, history of subarachnoid hemorrhage, history of seizure disorder, who was previously diagnosed with obstructive sleep apnea. She has not been on CPAP for 5 years. She presents for re-evaluation. The patient endorsed the Epworth Sleepiness Scale at 13 points. The patient's weight 150 pounds with a height of 60 (inches), resulting in a BMI of 29.4 kg/m2. The patient's neck circumference measured 17 inches. ? ?CURRENT MEDICATIONS: Zyloprim, Eliquis, Vitamin D3, Epipen, Ferrous Sulfate, Cozaar, Magnesium Oxide, Toprol-XL, Multivitamin Adult, Nitrostat, Omega-3, Klor-Con, Pravachol, Aldactone, Tobrex, Demadex, Cyancobalamin ?  ?PROCEDURE:  This is a multichannel digital polysomnogram utilizing the Somnostar 11.2 system.  Electrodes and sensors were applied and monitored per AASM Specifications.   EEG, EOG, Chin and Limb EMG, were sampled at 200 Hz.  ECG, Snore and Nasal Pressure, Thermal Airflow, Respiratory Effort, CPAP Flow and Pressure, Oximetry was sampled at 50 Hz. Digital video and audio were recorded.     ? ?BASELINE STUDY ? ?Lights Out was at 20:28 and Lights On at 05:00.  Total recording time (TRT) was 512.5 minutes, with a total sleep time (TST) of 194.5 minutes.   The patient's sleep latency was 52 minutes, which is delayed. REM latency was 291.5 minutes, which is markedly delayed. The sleep efficiency was 38%, which is markedly reduced.  ?   ?SLEEP ARCHITECTURE: WASO (Wake after sleep onset) was 176 minutes with moderate sleep fragmentation noted and 2 longer periods of wakefulness.  There were 8.5 minutes in Stage N1, 119 minutes Stage N2, 58.5 minutes Stage N3 and 8.5 minutes in Stage REM.  The percentage of Stage N1 was 4.4%, Stage N2 was 61.2%, which is increased, Stage N3 was 30.1%, which is increased, and Stage R (REM sleep) was 4.4%, which is markedly reduced. The arousals were noted as: 11 were spontaneous, 0 were associated with PLMs, 0 were associated with respiratory events. ? ?RESPIRATORY ANALYSIS:  There were a total of 0 respiratory events:  0 obstructive apneas, 0 central apneas and 0 mixed apneas with a total of 0 apneas and an apnea index (AI) of 0 /hour. There were 0 hypopneas with a hypopnea index of 0 /hour. The patient also had 0 respiratory event related arousals (RERAs).  ?    ?The total APNEA/HYPOPNEA INDEX (AHI) was 0/hour and the total RESPIRATORY DISTURBANCE INDEX was  0 /hour.  0 events occurred in REM sleep and 0 events in NREM. The REM AHI was  0 /hour, versus a non-REM AHI of 0. The patient spent 0 minutes of total sleep time in the supine position and 195 minutes in non-supine.. The supine AHI was 0.0 versus a non-supine AHI of 0.0. ? ?OXYGEN SATURATION & C02:  The Wake baseline 02 saturation was 94%, with the lowest being 91%. Time spent below 89% saturation equaled 0 minutes. ? ?PERIODIC LIMB MOVEMENTS: The patient had a total of 0 Periodic Limb Movements.  The Periodic Limb Movement (PLM) index was 0 and the PLM Arousal index was 0/hour. ? ?Audio and video analysis did not show any abnormal or unusual movements, behaviors, phonations or vocalizations. The patient took 1  bathroom break. Intermittent mild to moderate snoring was noted. The EKG appeared abnormal with a widened QRS complex, but rhythm appeared regular.  ?Post-study, the patient indicated that sleep was the same as usual.  ? ?IMPRESSION: ?Primary Snoring ?Dysfunctions associated with sleep stages or arousal from sleep - poor sleep pattern ?Non-specific abnormal EKG ? ?RECOMMENDATIONS: ?This study  does not demonstrate any significant obstructive or central sleep disordered breathing with the exception of snoring, AHI was 0/hour and O2 nadir 91%. However, the study was limited due to poor sleep efficiency, poor sleep consolidation and absence of supine sleep, near absence of REM sleep. Based on the current results, treatment with a positive airway pressure device is not indicated. Some weight loss and ongoing avoidance of the supine sleep position, may reduce her snoring. This study does not support an intrinsic sleep disorder as a cause of the patient's symptoms. Other causes, including circadian rhythm disturbances, an underlying mood disorder, medication effect and/or an underlying medical problem cannot be ruled out. ?This study shows sleep fragmentation and abnormal sleep stage percentages; these are nonspecific findings and per se do not signify an intrinsic sleep disorder or a cause for the patient's sleep-related symptoms. Causes include (but are not limited to) the first night effect of the sleep study, circadian rhythm disturbances, medication effect or an underlying mood disorder or medical problem.  ?The patient should be cautioned not to drive, work at heights, or operate dangerous or heavy equipment when tired or sleepy. Review and reiteration of good sleep hygiene measures should be pursued with any patient. ?The study showed an abnormal EKG; clinical correlation is recommended. The patient is closely followed by cardiology. ?The patient will be advised to follow up with the referring provider, who will be notified of the test results. ? ?I certify that I have reviewed the entire raw data recording prior to the issuance of this report in accordance with the Standards of Accreditation of the [place] [employer] (AASM) ? ?Star Age, MD, PhD ?Diplomat, American Board of Neurology and Sleep Medicine (Neurology and Sleep Medicine) ?

## 2021-05-11 ENCOUNTER — Telehealth: Payer: Self-pay

## 2021-05-11 NOTE — Telephone Encounter (Signed)
-----   Message from Star Age, MD sent at 05/08/2021  1:28 PM EDT ----- ?Patient referred by Dr. Harrell Lark, seen by me on 04/22/21, diagnostic PSG on 05/03/21.   ?Please call and notify the patient that the recent sleep study did not show any significant obstructive sleep apnea with the exception of snoring, overall AHI was 0/hour and O2 nadir 91%. However, the study was limited due to poor sleep efficiency, poor sleep consolidation and absence of supine sleep, near absence of REM sleep. Based on the current results, treatment with a positive airway pressure device (ie CPAP) is not indicated. Some weight loss and ongoing avoidance of the supine sleep position, may reduce her snoring.  ?At this juncture, she can FU with PCP and her other providers as scheduled/planned.  ? ?Thanks, ? ?Star Age, MD, PhD ?[facility]) ? ?

## 2021-05-11 NOTE — Telephone Encounter (Signed)
I called patient. I discussed her sleep study results and recommendations. Patient will follow up with her PCP as scheduled. Pt verbalized understanding of results. Pt had no questions at this time but was encouraged to call back if questions arise. ? ?

## 2021-05-21 NOTE — Progress Notes (Signed)
Remote pacemaker transmission.   

## 2021-08-03 ENCOUNTER — Encounter: Payer: Self-pay | Admitting: Gastroenterology

## 2021-08-04 ENCOUNTER — Ambulatory Visit (INDEPENDENT_AMBULATORY_CARE_PROVIDER_SITE_OTHER): Payer: Medicare Other

## 2021-08-04 DIAGNOSIS — Z950 Presence of cardiac pacemaker: Secondary | ICD-10-CM

## 2021-08-04 LAB — CUP PACEART REMOTE DEVICE CHECK
Battery Remaining Longevity: 135 mo
Battery Voltage: 3.03 V
Brady Statistic AP VP Percent: 7.64 %
Brady Statistic AP VS Percent: 0 %
Brady Statistic AS VP Percent: 92.25 %
Brady Statistic AS VS Percent: 0.11 %
Brady Statistic RA Percent Paced: 7.64 %
Brady Statistic RV Percent Paced: 99.89 %
Date Time Interrogation Session: 20230724062737
Implantable Lead Implant Date: 20220331
Implantable Lead Implant Date: 20220331
Implantable Lead Location: 753859
Implantable Lead Location: 753860
Implantable Lead Model: 5076
Implantable Lead Model: 5076
Implantable Pulse Generator Implant Date: 20220331
Lead Channel Impedance Value: 323 Ohm
Lead Channel Impedance Value: 380 Ohm
Lead Channel Impedance Value: 456 Ohm
Lead Channel Impedance Value: 570 Ohm
Lead Channel Pacing Threshold Amplitude: 0.625 V
Lead Channel Pacing Threshold Amplitude: 0.75 V
Lead Channel Pacing Threshold Pulse Width: 0.4 ms
Lead Channel Pacing Threshold Pulse Width: 0.4 ms
Lead Channel Sensing Intrinsic Amplitude: 12 mV
Lead Channel Sensing Intrinsic Amplitude: 12 mV
Lead Channel Sensing Intrinsic Amplitude: 2.5 mV
Lead Channel Sensing Intrinsic Amplitude: 2.5 mV
Lead Channel Setting Pacing Amplitude: 1.5 V
Lead Channel Setting Pacing Amplitude: 2 V
Lead Channel Setting Pacing Pulse Width: 0.4 ms
Lead Channel Setting Sensing Sensitivity: 1.2 mV

## 2021-09-01 NOTE — Progress Notes (Signed)
Remote pacemaker transmission.   

## 2021-09-10 ENCOUNTER — Ambulatory Visit: Payer: Medicare Other | Attending: Cardiology | Admitting: Cardiology

## 2021-09-10 ENCOUNTER — Encounter: Payer: Self-pay | Admitting: Cardiology

## 2021-09-10 VITALS — BP 142/64 | HR 92 | Ht 60.0 in | Wt 153.2 lb

## 2021-09-10 DIAGNOSIS — I484 Atypical atrial flutter: Secondary | ICD-10-CM

## 2021-09-10 DIAGNOSIS — I5032 Chronic diastolic (congestive) heart failure: Secondary | ICD-10-CM

## 2021-09-10 DIAGNOSIS — E7800 Pure hypercholesterolemia, unspecified: Secondary | ICD-10-CM

## 2021-09-10 DIAGNOSIS — Z951 Presence of aortocoronary bypass graft: Secondary | ICD-10-CM

## 2021-09-10 DIAGNOSIS — Z950 Presence of cardiac pacemaker: Secondary | ICD-10-CM | POA: Diagnosis not present

## 2021-09-10 DIAGNOSIS — Z7901 Long term (current) use of anticoagulants: Secondary | ICD-10-CM

## 2021-09-10 DIAGNOSIS — I130 Hypertensive heart and chronic kidney disease with heart failure and stage 1 through stage 4 chronic kidney disease, or unspecified chronic kidney disease: Secondary | ICD-10-CM

## 2021-09-10 DIAGNOSIS — Z954 Presence of other heart-valve replacement: Secondary | ICD-10-CM | POA: Diagnosis not present

## 2021-09-10 NOTE — Progress Notes (Signed)
Cardiology Office Note:    Date:  09/10/2021   ID:  [name], DOB 1935/02/05, MRN [id number]  PCP:  Verdell Carmine., MD  Cardiologist:  Shirlee More, MD    Referring MD: Verdell Carmine., MD    ASSESSMENT:    1. Atypical atrial flutter (HCC)   2. Cardiac pacemaker in situ   3. Chronic anticoagulation   4. H/O aortic valve replacement with tissue graft   5. Hx of CABG   6. Hypertensive heart and chronic kidney disease with chronic diastolic congestive heart failure, unspecified CKD stage ([place])   7. High cholesterol    PLAN:    In order of problems listed above:  Stable not having clinical recurrence of atrial fibrillation or flutter normal pacemaker function she made decision to transition to aspirin from Eliquis.  I would not restart her beta-blocker at this time Stable after cardiac surgery AVR and CABG Blood pressure at target continue current medical treatment including her loop and distal diuretic and ARB Stable CKD Continue her high intensity statin her LDL is been at target   Next appointment: 6 months   Medication Adjustments/Labs and Tests Ordered: Current medicines are reviewed at length with the patient today.  Concerns regarding medicines are outlined above.  No orders of the defined types were placed in this encounter.  No orders of the defined types were placed in this encounter.   Follow-up pacemaker CAD aortic valve replacement atrial arrhythmia   History of Present Illness:    [name] is a 86 y.o. female with a hx of atrial flutter chronic anticoagulation previous subarachnoid hemorrhage bioprosthetic AVR and history of CABG in 2011 permanent pacemaker hypertensive heart and chronic kidney disease with heart failure and hyper lipidemia she was last seen 02/20/2021.[name] Echocardiogram in my office 06/13/2020 showed normal left ventricular size function EF 60 to 65% mild right ventricular dysfunction mild elevation of pulmonary artery pressure left  atrium was mildly enlarged there is mild calcific mitral stenosis and AVR evaluation was normal.    Compliance with diet, lifestyle and medications: Yes  She is very pleased with the quality of her life tells me is the best she has felt in years and recently her beta-blocker was discontinued At the last visit she stopped Eliquis she has been taking aspirin she understand there is a risk of stroke but at this point time does not want to resume anticoagulation.  I respect her decision She is not having edema shortness of breath chest pain palpitation or syncope  Recent labs 05/18/2021 from her [facility] atrium PCP shows sodium 140 potassium 4.3 creatinine 1.31 GFR 40 cc LDL 84 hemoglobin 11.6  Most recent device check showed AF burden of less than 1/10 of 1% longest episode 11 minutes. Past Medical History:  Diagnosis Date   Angina pectoris ([place]) 12/30/2014   Beat, premature ventricular 12/30/2014   CAD in native artery 12/30/2014   Cardiorenal disease 12/30/2014   Chronic diastolic heart failure ([place]) 12/30/2014   CKD (chronic kidney disease) stage 3, GFR 30-59 ml/min ([place]) 12/30/2014   Diabetes ([place])    Dyslipidemia 03/20/2018   H/O aortic valve replacement with tissue graft 12/30/2014   Overview:  S/P AVR with tissue valve, March 2011, #21 [facility] Ease bovine and CABG   High blood pressure    High cholesterol    Incarcerated incisional hernia 05/14/2015   Type 2 diabetes mellitus with peripheral angiopathy ([place]) 04/24/2015    Past Surgical History:  Procedure Laterality Date   ABDOMINAL HERNIA REPAIR  2017   CARDIAC VALVE REPLACEMENT     CATARACT EXTRACTION W/ INTRAOCULAR LENS IMPLANT     CHOLECYSTECTOMY      Current Medications: Current Meds  Medication Sig   allopurinol (ZYLOPRIM) 100 MG tablet Take 100 mg by mouth 2 (two) times daily.   aspirin EC 81 MG tablet Take 81 mg by mouth daily. Swallow whole.   cholecalciferol (VITAMIN D3) 25 MCG (1000 UNIT) tablet  Take 1,000 Units by mouth daily.   colchicine 0.6 MG tablet Take 0.6 mg by mouth daily as needed ([place]).   EPIPEN 2-PAK 0.3 MG/0.3ML SOAJ injection Inject 0.3 mg into the muscle as needed for anaphylaxis.   ferrous sulfate 325 (65 FE) MG tablet Take 325 mg by mouth daily.    losartan (COZAAR) 25 MG tablet Take 12.5 mg by mouth daily.   MAGNESIUM OXIDE PO Take 30 mg by mouth daily.   Multiple Vitamins-Minerals (MULTIVITAMIN ADULTS) TABS Take 1 tablet by mouth daily.   nitroGLYCERIN (NITROSTAT) 0.4 MG SL tablet Place 0.4 mg under the tongue as needed for chest pain.   Omega-3 1000 MG CAPS Take 1,000 mg by mouth 2 (two) times daily.   potassium chloride (KLOR-CON) 10 MEQ tablet Take 10 mEq by mouth every evening.   pravastatin (PRAVACHOL) 80 MG tablet Take 80 mg by mouth daily.   spironolactone (ALDACTONE) 25 MG tablet Take 12.5 mg by mouth daily.   tobramycin (TOBREX) 0.3 % ophthalmic solution Place 1 drop into both eyes See admin instructions. Take every 3 months before and after shot in eye   torsemide (DEMADEX) 10 MG tablet Take 10 mg by mouth daily.   vitamin B-12 (CYANOCOBALAMIN) 1000 MCG tablet Take 1,000 mcg by mouth daily.     Allergies:   Januvia [sitagliptin], Keflex [cephalexin], Levetiracetam, and Tape   Social History   Socioeconomic History   Marital status: Widowed    Spouse name: Not on file   Number of children: 1   Years of education: Not on file   Highest education level: Not on file  Occupational History   Not on file  Tobacco Use   Smoking status: Never   Smokeless tobacco: Never  Substance and Sexual Activity   Alcohol use: Yes    Comment: OCC   Drug use: No   Sexual activity: Not Currently  Other Topics Concern   Not on file  Social History Narrative   Lives home alone   Right handed   Drinks 3-4 cups caffeine daily   Social Determinants of Health   Financial Resource Strain: Not on file  Food Insecurity: Not on file  Transportation Needs: Not on  file  Physical Activity: Not on file  Stress: Not on file  Social Connections: Not on file     Family History: The patient's family history includes Congestive Heart Failure in her mother; Diabetes in her mother; Heart attack in her father; High blood pressure in her mother; Throat cancer in her father. There is no history of Sleep apnea. ROS:   Please see the history of present illness.    All other systems reviewed and are negative.  EKGs/Labs/Other Studies Reviewed:    The following studies were reviewed today:     Physical Exam:    VS:  BP (!) 142/64 (BP Location: Left Arm, Patient Position: Sitting)   Pulse 92   Ht 5' (1.524 m)   Wt 153 lb 3.2 oz (69.5 kg)  SpO2 99%   BMI 29.92 kg/m     Wt Readings from Last 3 Encounters:  09/10/21 153 lb 3.2 oz (69.5 kg)  04/22/21 150 lb 12.8 oz (68.4 kg)  02/20/21 153 lb (69.4 kg)     GEN:  Well nourished, well developed in no acute distress HEENT: Normal NECK: No JVD; No carotid bruits LYMPHATICS: No lymphadenopathy CARDIAC: RRR, no murmurs, rubs, gallops RESPIRATORY:  Clear to auscultation without rales, wheezing or rhonchi  ABDOMEN: Soft, non-tender, non-distended MUSCULOSKELETAL:  No edema; No deformity  SKIN: Warm and dry NEUROLOGIC:  Alert and oriented x 3 PSYCHIATRIC:  Normal affect    Signed, Shirlee More, MD  09/10/2021 11:24 AM    [place]

## 2021-09-10 NOTE — Patient Instructions (Signed)
Medication Instructions:  Your physician recommends that you continue on your current medications as directed. Please refer to the Current Medication list given to you today.  *If you need a refill on your cardiac medications before your next appointment, please call your pharmacy*   Lab Work: None If you have labs (blood work) drawn today and your tests are completely normal, you will receive your results only by: MyChart Message (if you have MyChart) OR A paper copy in the mail If you have any lab test that is abnormal or we need to change your treatment, we will call you to review the results.   Testing/Procedures: None   Follow-Up: At [facility], you and your health needs are our priority.  As part of our continuing mission to provide you with exceptional heart care, we have created designated Provider Care Teams.  These Care Teams include your primary Cardiologist (physician) and Advanced Practice Providers (APPs -  Physician Assistants and Nurse Practitioners) who all work together to provide you with the care you need, when you need it.  We recommend signing up for the patient portal called "MyChart".  Sign up information is provided on this After Visit Summary.  MyChart is used to connect with patients for Virtual Visits (Telemedicine).  Patients are able to view lab/test results, encounter notes, upcoming appointments, etc.  Non-urgent messages can be sent to your provider as well.   To learn more about what you can do with MyChart, go to [email or web address].    Your next appointment:   6 month(s)  The format for your next appointment:   In Person  Provider:   Brian Munley, MD    Other Instructions None  Important Information About Sugar       

## 2021-11-03 ENCOUNTER — Ambulatory Visit (INDEPENDENT_AMBULATORY_CARE_PROVIDER_SITE_OTHER): Payer: Medicare Other

## 2021-11-03 DIAGNOSIS — I517 Cardiomegaly: Secondary | ICD-10-CM | POA: Diagnosis not present

## 2021-11-03 LAB — CUP PACEART REMOTE DEVICE CHECK
Battery Remaining Longevity: 130 mo
Battery Voltage: 3.02 V
Brady Statistic AP VP Percent: 16.72 %
Brady Statistic AP VS Percent: 0 %
Brady Statistic AS VP Percent: 83.21 %
Brady Statistic AS VS Percent: 0.07 %
Brady Statistic RA Percent Paced: 16.7 %
Brady Statistic RV Percent Paced: 99.93 %
Date Time Interrogation Session: 20231023084648
Implantable Lead Connection Status: 753985
Implantable Lead Connection Status: 753985
Implantable Lead Implant Date: 20220331
Implantable Lead Implant Date: 20220331
Implantable Lead Location: 753859
Implantable Lead Location: 753860
Implantable Lead Model: 5076
Implantable Lead Model: 5076
Implantable Pulse Generator Implant Date: 20220331
Lead Channel Impedance Value: 304 Ohm
Lead Channel Impedance Value: 380 Ohm
Lead Channel Impedance Value: 418 Ohm
Lead Channel Impedance Value: 532 Ohm
Lead Channel Pacing Threshold Amplitude: 0.625 V
Lead Channel Pacing Threshold Amplitude: 0.625 V
Lead Channel Pacing Threshold Pulse Width: 0.4 ms
Lead Channel Pacing Threshold Pulse Width: 0.4 ms
Lead Channel Sensing Intrinsic Amplitude: 12 mV
Lead Channel Sensing Intrinsic Amplitude: 12 mV
Lead Channel Sensing Intrinsic Amplitude: 2.25 mV
Lead Channel Sensing Intrinsic Amplitude: 2.25 mV
Lead Channel Setting Pacing Amplitude: 1.5 V
Lead Channel Setting Pacing Amplitude: 2 V
Lead Channel Setting Pacing Pulse Width: 0.4 ms
Lead Channel Setting Sensing Sensitivity: 1.2 mV
Zone Setting Status: 755011
Zone Setting Status: 755011

## 2021-11-23 NOTE — Progress Notes (Signed)
Remote pacemaker transmission.   

## 2022-02-02 ENCOUNTER — Ambulatory Visit: Payer: Medicare Other | Attending: Cardiology

## 2022-02-02 DIAGNOSIS — I442 Atrioventricular block, complete: Secondary | ICD-10-CM

## 2022-02-02 LAB — CUP PACEART REMOTE DEVICE CHECK
Battery Remaining Longevity: 127 mo
Battery Voltage: 3.02 V
Brady Statistic AP VP Percent: 33.45 %
Brady Statistic AP VS Percent: 0 %
Brady Statistic AS VP Percent: 66.53 %
Brady Statistic AS VS Percent: 0.02 %
Brady Statistic RA Percent Paced: 33.42 %
Brady Statistic RV Percent Paced: 99.97 %
Date Time Interrogation Session: 20240122041522
Implantable Lead Connection Status: 753985
Implantable Lead Connection Status: 753985
Implantable Lead Implant Date: 20220331
Implantable Lead Implant Date: 20220331
Implantable Lead Location: 753859
Implantable Lead Location: 753860
Implantable Lead Model: 5076
Implantable Lead Model: 5076
Implantable Pulse Generator Implant Date: 20220331
Lead Channel Impedance Value: 285 Ohm
Lead Channel Impedance Value: 380 Ohm
Lead Channel Impedance Value: 418 Ohm
Lead Channel Impedance Value: 532 Ohm
Lead Channel Pacing Threshold Amplitude: 0.625 V
Lead Channel Pacing Threshold Amplitude: 0.625 V
Lead Channel Pacing Threshold Pulse Width: 0.4 ms
Lead Channel Pacing Threshold Pulse Width: 0.4 ms
Lead Channel Sensing Intrinsic Amplitude: 12 mV
Lead Channel Sensing Intrinsic Amplitude: 12 mV
Lead Channel Sensing Intrinsic Amplitude: 2.125 mV
Lead Channel Sensing Intrinsic Amplitude: 2.125 mV
Lead Channel Setting Pacing Amplitude: 1.5 V
Lead Channel Setting Pacing Amplitude: 2 V
Lead Channel Setting Pacing Pulse Width: 0.4 ms
Lead Channel Setting Sensing Sensitivity: 1.2 mV
Zone Setting Status: 755011
Zone Setting Status: 755011

## 2022-02-09 ENCOUNTER — Other Ambulatory Visit: Payer: Self-pay | Admitting: Cardiology

## 2022-02-14 DIAGNOSIS — I700 Atherosclerosis of aorta: Secondary | ICD-10-CM

## 2022-02-14 HISTORY — DX: Atherosclerosis of aorta: I70.0

## 2022-02-21 ENCOUNTER — Other Ambulatory Visit: Payer: Self-pay | Admitting: Cardiology

## 2022-03-02 NOTE — Progress Notes (Signed)
Remote pacemaker transmission.   

## 2022-03-07 NOTE — Progress Notes (Deleted)
Cardiology Office Note:    Date:  03/07/2022   ID:  LTONYA CHAIRES, DOB 1935-07-06, MRN [id number]  PCP:  Verdell Carmine., MD  Cardiologist:  Shirlee More, MD    Referring MD: Verdell Carmine., MD    ASSESSMENT:    No diagnosis found. PLAN:    In order of problems listed above:  ***   Next appointment: ***   Medication Adjustments/Labs and Tests Ordered: Current medicines are reviewed at length with the patient today.  Concerns regarding medicines are outlined above.  No orders of the defined types were placed in this encounter.  No orders of the defined types were placed in this encounter.   No chief complaint on file.   History of Present Illness:    [name] is a 87 y.o. female with a hx of atrial flutter maintaining sinus rhythm chronic anticoagulation previous traumatic subarachnoid hemorrhage bioprosthetic AVR and history of CABG in 2011 permanent pacemaker hypertensive heart and chronic kidney disease with heart failure and hyperlipidemia 09/10/2021 last seen . Echocardiogram in my office 06/13/2020 showed normal left ventricular size function EF 60 to 65% mild right ventricular dysfunction mild elevation of pulmonary artery pressure left atrium was mildly enlarged there is mild calcific mitral stenosis and AVR evaluation was normal.  Compliance with diet, lifestyle and medications: *** Past Medical History:  Diagnosis Date   Angina pectoris ([place]) 12/30/2014   Beat, premature ventricular 12/30/2014   CAD in native artery 12/30/2014   Cardiorenal disease 12/30/2014   Chronic diastolic heart failure ([place]) 12/30/2014   CKD (chronic kidney disease) stage 3, GFR 30-59 ml/min ([place]) 12/30/2014   Diabetes ([place])    Dyslipidemia 03/20/2018   H/O aortic valve replacement with tissue graft 12/30/2014   Overview:  S/P AVR with tissue valve, March 2011, #21 [facility] Ease bovine and CABG   High blood pressure    High cholesterol    Incarcerated incisional hernia  05/14/2015   Type 2 diabetes mellitus with peripheral angiopathy ([place]) 04/24/2015    Past Surgical History:  Procedure Laterality Date   ABDOMINAL HERNIA REPAIR  2017   CARDIAC VALVE REPLACEMENT     CATARACT EXTRACTION W/ INTRAOCULAR LENS IMPLANT     CHOLECYSTECTOMY      Current Medications: No outpatient medications have been marked as taking for the 03/09/22 encounter (Appointment) with Richardo Priest, MD.     Allergies:   Januvia [sitagliptin], Keflex [cephalexin], Levetiracetam, and Tape   Social History   Socioeconomic History   Marital status: Widowed    Spouse name: Not on file   Number of children: 1   Years of education: Not on file   Highest education level: Not on file  Occupational History   Not on file  Tobacco Use   Smoking status: Never   Smokeless tobacco: Never  Substance and Sexual Activity   Alcohol use: Yes    Comment: OCC   Drug use: No   Sexual activity: Not Currently  Other Topics Concern   Not on file  Social History Narrative   Lives home alone   Right handed   Drinks 3-4 cups caffeine daily   Social Determinants of Health   Financial Resource Strain: Not on file  Food Insecurity: Not on file  Transportation Needs: Not on file  Physical Activity: Not on file  Stress: Not on file  Social Connections: Not on file     Family History: The patient's ***family history includes Congestive Heart Failure in  her mother; Diabetes in her mother; Heart attack in her father; High blood pressure in her mother; Throat cancer in her father. There is no history of Sleep apnea. ROS:   Please see the history of present illness.    All other systems reviewed and are negative.  EKGs/Labs/Other Studies Reviewed:    The following studies were reviewed today:  EKG:  EKG ordered today and personally reviewed.  The ekg ordered today demonstrates ***  Recent Labs: No results found for requested labs within last 365 days.  Recent Lipid Panel No results  found for: "CHOL", "TRIG", "HDL", "CHOLHDL", "VLDL", "LDLCALC", "LDLDIRECT"  Physical Exam:    VS:  There were no vitals taken for this visit.    Wt Readings from Last 3 Encounters:  09/10/21 153 lb 3.2 oz (69.5 kg)  04/22/21 150 lb 12.8 oz (68.4 kg)  02/20/21 153 lb (69.4 kg)     GEN: *** Well nourished, well developed in no acute distress HEENT: Normal NECK: No JVD; No carotid bruits LYMPHATICS: No lymphadenopathy CARDIAC: ***RRR, no murmurs, rubs, gallops RESPIRATORY:  Clear to auscultation without rales, wheezing or rhonchi  ABDOMEN: Soft, non-tender, non-distended MUSCULOSKELETAL:  No edema; No deformity  SKIN: Warm and dry NEUROLOGIC:  Alert and oriented x 3 PSYCHIATRIC:  Normal affect    Signed, Shirlee More, MD  03/07/2022 11:31 AM    [place]

## 2022-03-09 ENCOUNTER — Other Ambulatory Visit: Payer: Self-pay

## 2022-03-09 ENCOUNTER — Telehealth: Payer: Self-pay | Admitting: Cardiology

## 2022-03-09 ENCOUNTER — Ambulatory Visit: Payer: Medicare Other | Admitting: Cardiology

## 2022-03-09 MED ORDER — TORSEMIDE 10 MG PO TABS: 10.0000 mg | ORAL_TABLET | Freq: Every day | ORAL | 3 refills | Status: DC

## 2022-03-09 NOTE — Telephone Encounter (Signed)
*  STAT* If patient is at the pharmacy, call can be transferred to refill team.   1. Which medications need to be refilled? (please list name of each medication and dose if known)  torsemide (DEMADEX) 10 MG tablet   2. Which pharmacy/location (including street and city if local pharmacy) is medication to be sent to? [facility] ([place], [address] [place]    3. Do they need a 30 day or 90 day supply?  90 day supply

## 2022-03-09 NOTE — Telephone Encounter (Signed)
Left a detailed message explaining that her Torsemide prescription had been re-filled and if she had any questions to please call us back.

## 2022-05-03 LAB — CUP PACEART REMOTE DEVICE CHECK
Battery Remaining Longevity: 123 mo
Battery Voltage: 3.01 V
Brady Statistic AP VP Percent: 47.12 %
Brady Statistic AP VS Percent: 0 %
Brady Statistic AS VP Percent: 52.8 %
Brady Statistic AS VS Percent: 0.08 %
Brady Statistic RA Percent Paced: 47.07 %
Brady Statistic RV Percent Paced: 99.91 %
Date Time Interrogation Session: 20240421200617
Implantable Lead Connection Status: 753985
Implantable Lead Connection Status: 753985
Implantable Lead Implant Date: 20220331
Implantable Lead Implant Date: 20220331
Implantable Lead Location: 753859
Implantable Lead Location: 753860
Implantable Lead Model: 5076
Implantable Lead Model: 5076
Implantable Pulse Generator Implant Date: 20220331
Lead Channel Impedance Value: 304 Ohm
Lead Channel Impedance Value: 342 Ohm
Lead Channel Impedance Value: 437 Ohm
Lead Channel Impedance Value: 494 Ohm
Lead Channel Pacing Threshold Amplitude: 0.5 V
Lead Channel Pacing Threshold Amplitude: 0.5 V
Lead Channel Pacing Threshold Pulse Width: 0.4 ms
Lead Channel Pacing Threshold Pulse Width: 0.4 ms
Lead Channel Sensing Intrinsic Amplitude: 1.875 mV
Lead Channel Sensing Intrinsic Amplitude: 1.875 mV
Lead Channel Sensing Intrinsic Amplitude: 11.875 mV
Lead Channel Sensing Intrinsic Amplitude: 11.875 mV
Lead Channel Setting Pacing Amplitude: 1.5 V
Lead Channel Setting Pacing Amplitude: 2 V
Lead Channel Setting Pacing Pulse Width: 0.4 ms
Lead Channel Setting Sensing Sensitivity: 1.2 mV
Zone Setting Status: 755011
Zone Setting Status: 755011

## 2022-05-04 ENCOUNTER — Ambulatory Visit (INDEPENDENT_AMBULATORY_CARE_PROVIDER_SITE_OTHER): Payer: Medicare Other

## 2022-05-04 DIAGNOSIS — I442 Atrioventricular block, complete: Secondary | ICD-10-CM | POA: Diagnosis not present

## 2022-05-06 IMAGING — RF DG ESOPHAGUS
13 of 21 series · 13 of 24 positions shown · non-contrast
Comparison: Previous upper GI from [DATE].

CLINICAL DATA: Dysphagia.

EXAM:
ESOPHOGRAM / BARIUM SWALLOW / BARIUM TABLET STUDY
TECHNIQUE: Combined double contrast and single contrast examination performed
using effervescent crystals, thick barium liquid, and thin barium
liquid. The patient was observed with fluoroscopy swallowing a 13 mm
barium sulphate tablet.
FLUOROSCOPY:
Radiation Exposure Index (as provided by the fluoroscopic device):
9.8 mGy Kerma
Fluoroscopy time: 2 minutes 0 seconds

[Series 1: cp_standard · 1 of 37 frames shown (1 of 13)]
[frame 6/37]
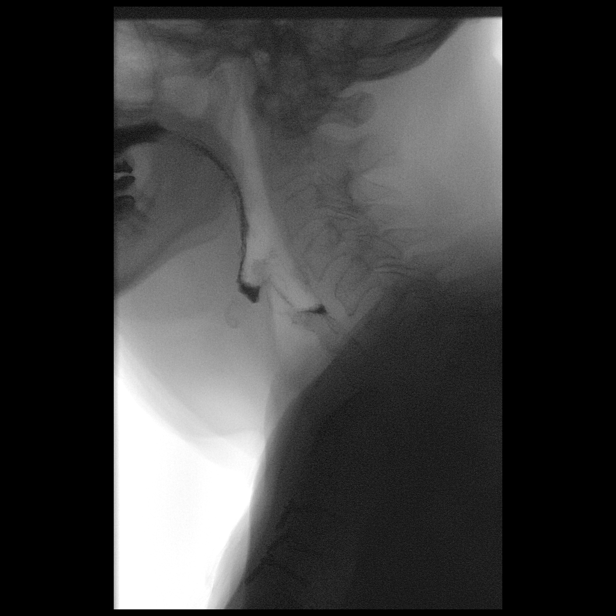

[Series 2: cp_standard · 1 of 48 frames shown (2 of 13)]
[frame 41/48]
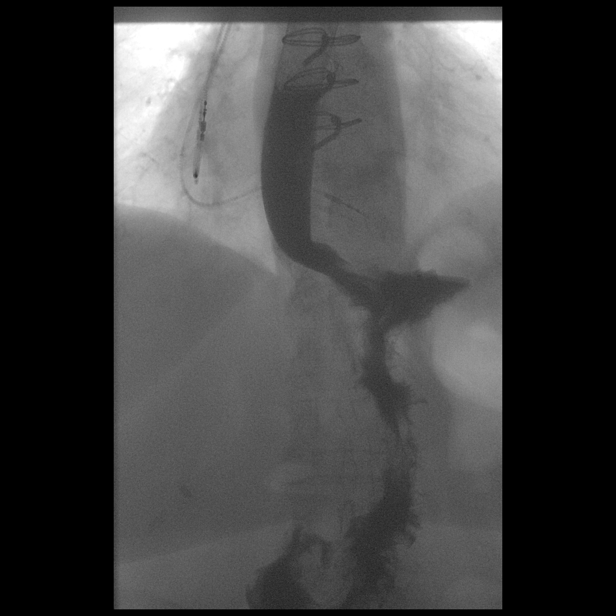

[Series 4: cp_standard · 1 of 52 frames shown (3 of 13)]
[frame 45/52]
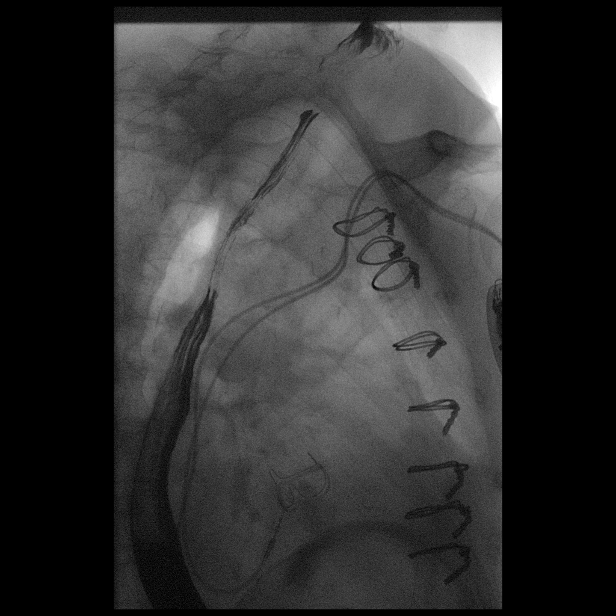

[Series 6: cp_standard · 1 of 44 frames shown (4 of 13)]
[frame 23/44]
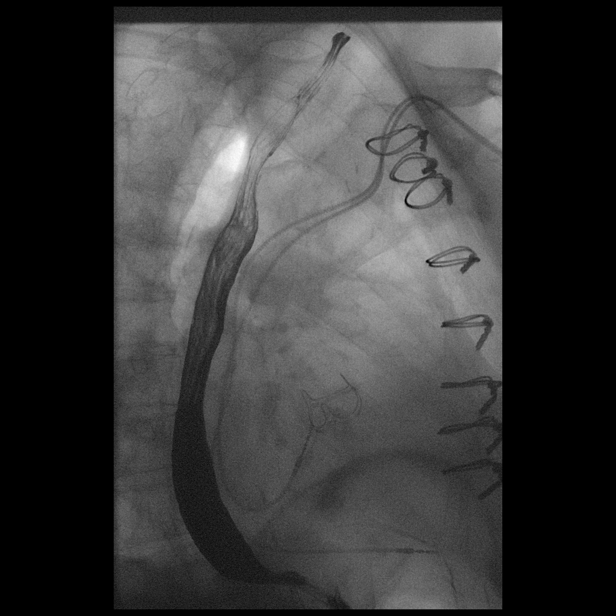

[Series 8: cp_standard · 1 of 12 frames shown (5 of 13)]
[frame 7/12]
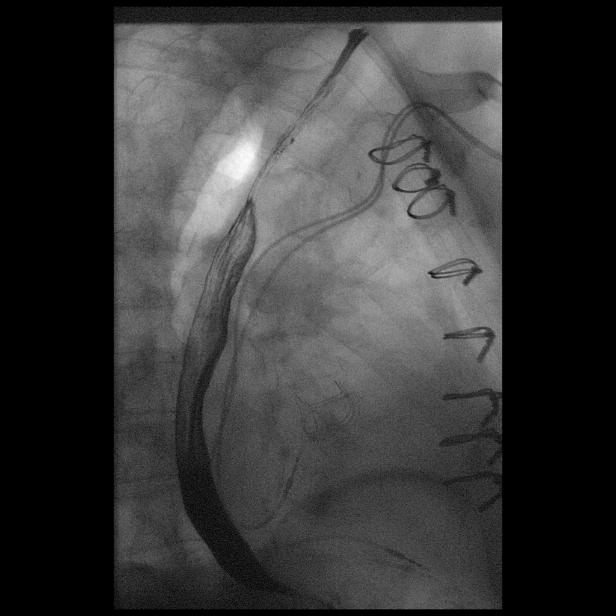

[Series 10: cp_standard · 1 of 54 frames shown (6 of 13)]
[frame 28/54]
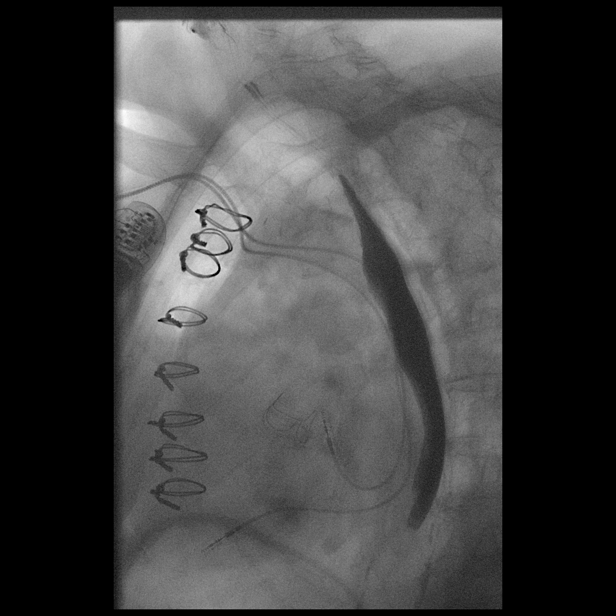

[Series 12: cp_standard · 1 of 49 frames shown (7 of 13)]
[frame 15/49]
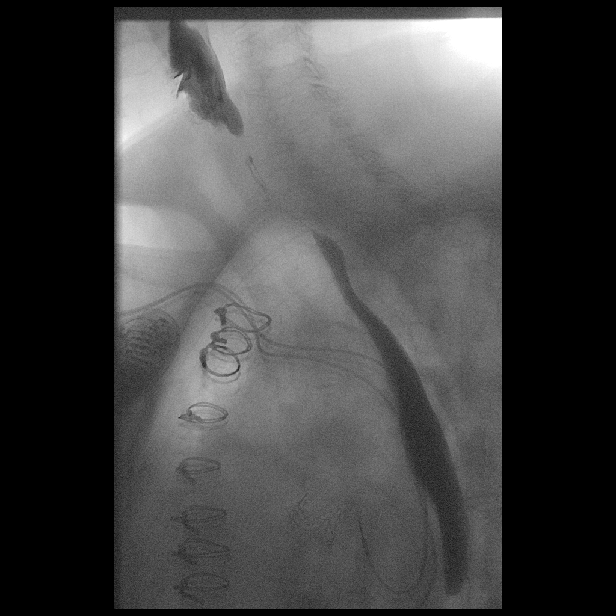

[Series 13: cp_standard · 1 of 45 frames shown (8 of 13)]
[frame 7/45]
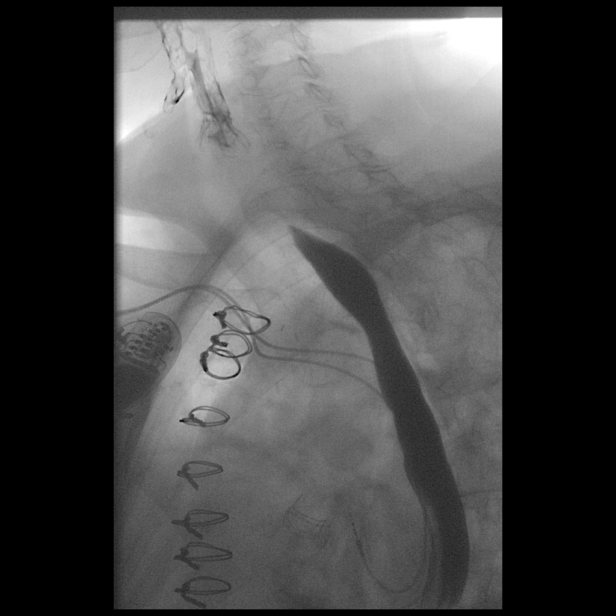

[Series 15: cp_standard · 1 of 8 frames shown (9 of 13)]
[frame 2/8]
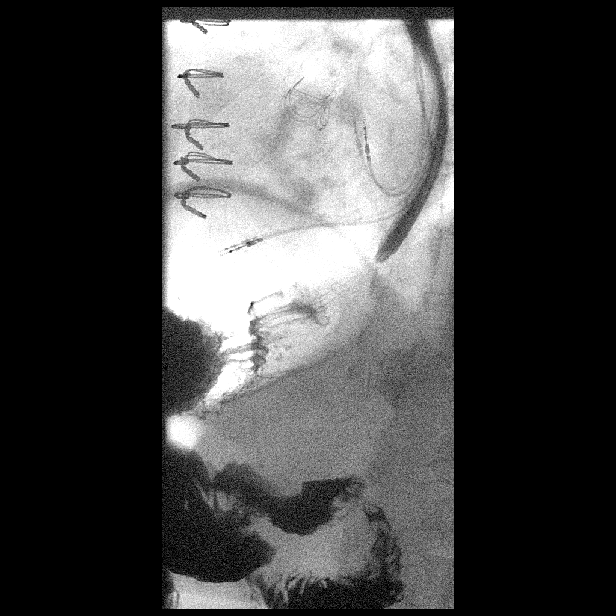

[Series 16: cp_standard · 1 of 10 frames shown (10 of 13)]
[frame 9/10]
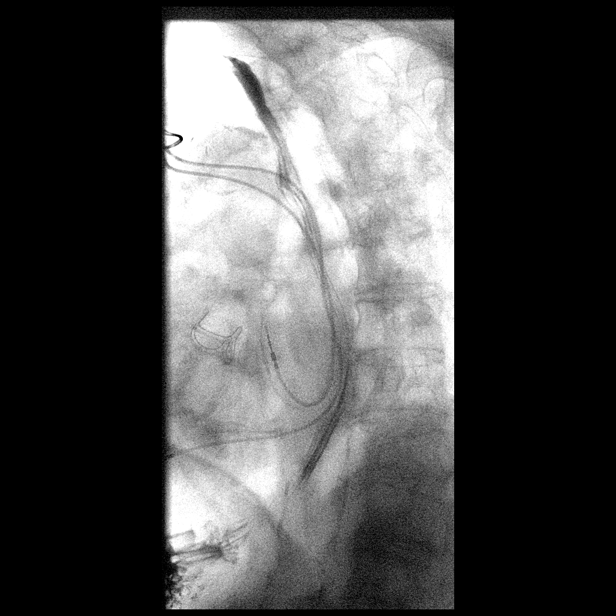

[Series 18: cp_standard · 1 of 3 frames shown (11 of 13)]
[frame 3/3]
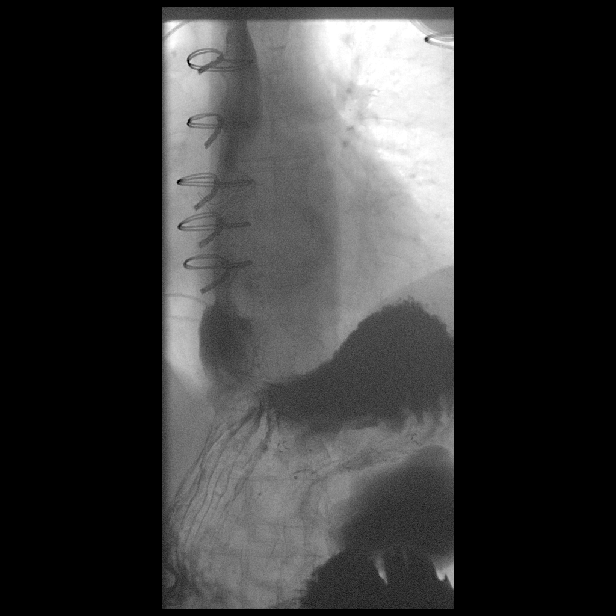

[Series 21: cp_standard · 1 of 64 frames shown (12 of 13)]
[frame 10/64]
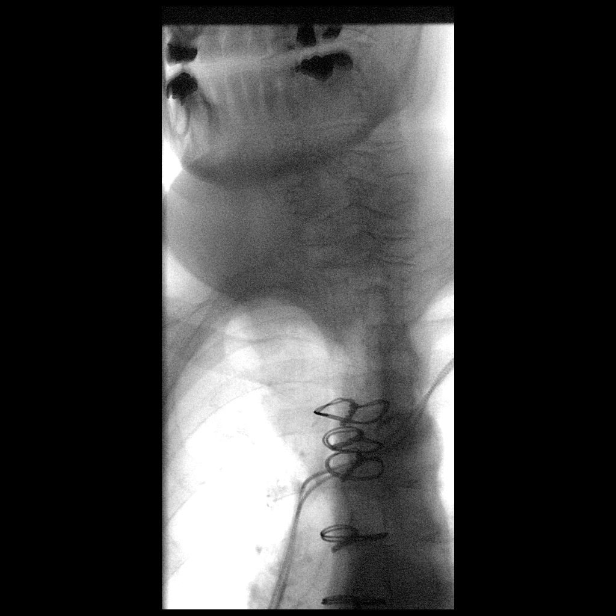

[Series 22: cp_standard · 1 of 7 frames shown (13 of 13)]
[frame 6/7]
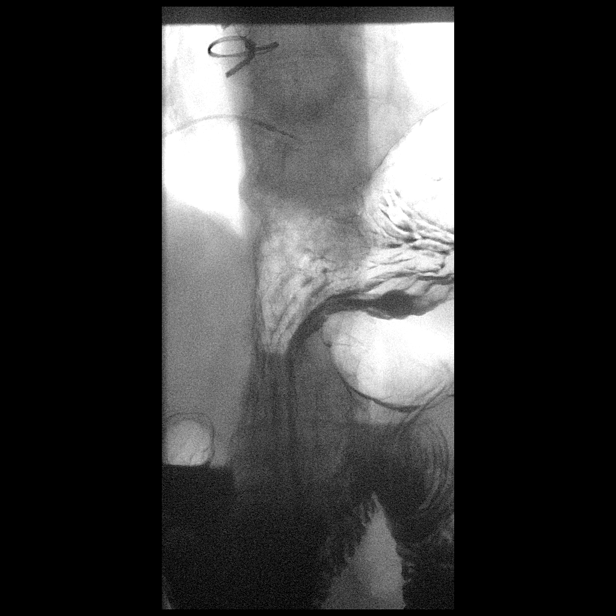

[13 of 24 positions shown; findings below may reference images not displayed]

FINDINGS: AP and lateral projections were obtained with swallowing of thin
barium showing no signs of gross esophageal dysfunction. On the AP
projection there is RIGHT lateral displacement of the proximal
esophagus with smooth borders suggesting extrinsic compression.

Effervescent crystals and thick barium within administered with
multiple swallows obtained showing normal distensibility of the
esophagus without signs of substantial fold thickening. Contrast
passed readily into the stomach.

Single swallow assessment shows intact primary wave without
substantial dysmotility. Full column assessment with normal
esophageal distensibility. Small Schatzki's ring which did not
impede progress of barium tablet through the esophagus into the
stomach. Mild irregularity of the CP segment of the esophagus at the
expected site of mass effect which did not appear irregular on the
AP projection is noted on the oblique projection. Some stasis above
the cricopharyngeus muscle of ingested contrast.

Gastroesophageal reflux was visible as was a small hiatal hernia
with productive maneuvers. Reflux extended into the proximal
esophagus.
IMPRESSION: 1. Small hiatal hernia with gastroesophageal reflux to the proximal
esophagus.
2. Small Schatzki's ring.
3. Mild irregularity of the CP segment of the esophagus which did
not impede progress of barium tablet. Significance uncertain.
Extrinsic compression is noted upon the proximal esophagus on the AP
projection. Suggest correlation with any signs of neck mass or
thyroid abnormality which would be the most common cause of this
appearance in this location.
4. Direct visualization of the upper esophagus and may be helpful to
exclude any abnormality in the CP segment of the esophagus as
suggested on lateral and oblique projections given mild potential
mucosal irregularity. Though this area does distend on AP and
oblique projections greater than on the frontal projection.

## 2022-05-11 NOTE — Progress Notes (Unsigned)
Cardiology Office Note:    Date:  05/12/2022   ID:  [name], DOB [birth year], MRN [id number]  PCP:  Raynelle Jan., MD  Cardiologist:  Norman Herrlich, MD    Referring MD: Raynelle Jan., MD    ASSESSMENT:    1. Heart block AV complete (HCC)   2. Cardiac pacemaker in situ   3. Atypical atrial flutter (HCC)   4. Hx of CABG   5. H/O aortic valve replacement with tissue graft   6. 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 Hypertensive heart and chronic kidney disease with heart failure and stage 1 through stage 4 chronic kidney disease, or chronic kidney disease (HCC)   8. High cholesterol   9. Nonrheumatic mitral valve stenosis    PLAN:    In order of problems listed above:  [name] continues to do well pacemaker is followed in our device clinic and no symptoms related to bradycardia Stable maintaining sinus rhythm Stable CAD no anginal discomfort continue aspirin and lipid-lowering with statin LDL is at target Normal AVR function on aspirin for antiplatelet therapy BP at target no evidence of fluid overload continue her loop diuretic MRA and ARB Stable CKD With CAD continue his statin her LDL is at target Stable calcific mitral valve disease   Next appointment: 6 months   Medication Adjustments/Labs and Tests Ordered: Current medicines are reviewed at length with the patient today.  Concerns regarding medicines are outlined above.  Orders Placed This Encounter  Procedures   EKG 12-Lead   No orders of the defined types were placed in this encounter.   No chief complaint on file.   History of Present Illness:    [name] is a 87 y.o. female with a hx of atrial flutter with chronic anticoagulation bioprosthetic AVR and CABG in 2011 permanent pacemaker hypertensive heart and chronic kidney disease with heart failure hyperlipidemia and previous subarachnoid hemorrhage last seen  09/10/2020.Echocardiogram in my office 06/13/2020 showed normal left ventricular size function EF 60 to 65% mild right ventricular dysfunction mild elevation of pulmonary artery pressure left atrium was mildly enlarged there is mild calcific mitral stenosis and AVR evaluation was normal.  Recent labs January and 02/10/2022 CMP normal normal liver function test creatinine 1.30 cholesterol 145 LDL 85 HDL 41  Compliance with diet, lifestyle and medications: Yes  She is now followed in our device clinic She is having a good summer feels well and is not having cardiovascular symptoms of edema shortness of breath chest pain palpitation or syncope She continues seeing the dermatologist for skin cancer of her scalp from a regular basis having topical treatment as well as cryotherapy She tolerates her statin without muscle pain or weakness Past Medical History:  Diagnosis Date   Angina pectoris (HCC) 12/30/2014   Beat, premature ventricular 12/30/2014   CAD in native artery 12/30/2014   Cardiorenal disease 12/30/2014   Chronic diastolic heart failure (HCC) 12/30/2014   CKD (chronic kidney disease) stage 3, GFR 30-59 ml/min (HCC) 12/30/2014   Diabetes (HCC)    Dyslipidemia 03/20/2018   H/O aortic valve replacement with tissue graft 12/30/2014   Overview:  S/P AVR with tissue valve, March 2011, #21 [facility] Ease bovine and CABG   High blood pressure    High cholesterol    Incarcerated incisional hernia 05/14/2015   Type 2 diabetes mellitus with peripheral angiopathy (HCC) 04/24/2015    Past Surgical History:  Procedure Laterality Date   ABDOMINAL HERNIA REPAIR  2017   CARDIAC VALVE REPLACEMENT     CATARACT EXTRACTION W/ INTRAOCULAR LENS IMPLANT     CHOLECYSTECTOMY      Current Medications: Current Meds  Medication Sig   allopurinol (ZYLOPRIM) 100 MG tablet Take 100 mg by mouth 2 (two) times daily.   aspirin EC 81 MG tablet Take 81 mg by mouth daily. Swallow whole.   cholecalciferol  (VITAMIN D3) 25 MCG (1000 UNIT) tablet Take 1,000 Units by mouth daily.   colchicine 0.6 MG tablet Take 0.6 mg by mouth daily as needed (Gout).   EPIPEN 2-PAK 0.3 MG/0.3ML SOAJ injection Inject 0.3 mg into the muscle as needed for anaphylaxis.   ferrous sulfate 325 (65 FE) MG tablet Take 325 mg by mouth daily.    losartan (COZAAR) 25 MG tablet Take 12.5 mg by mouth daily.   MAGNESIUM OXIDE PO Take 30 mg by mouth daily.   Multiple Vitamins-Minerals (MULTIVITAMIN ADULTS) TABS Take 1 tablet by mouth daily.   nitroGLYCERIN (NITROSTAT) 0.4 MG SL tablet Place 0.4 mg under the tongue as needed for chest pain.   Omega-3 1000 MG CAPS Take 1,000 mg by mouth 2 (two) times daily.   potassium chloride (KLOR-CON) 10 MEQ tablet Take 10 mEq by mouth every evening.   pravastatin (PRAVACHOL) 80 MG tablet Take 80 mg by mouth daily.   spironolactone (ALDACTONE) 25 MG tablet Take 12.5 mg by mouth daily.   tobramycin (TOBREX) 0.3 % ophthalmic solution Place 1 drop into both eyes See admin instructions. Take every 3 months before and after shot in eye   torsemide (DEMADEX) 10 MG tablet Take 1 tablet (10 mg total) by mouth daily.   vitamin B-12 (CYANOCOBALAMIN) 1000 MCG tablet Take 1,000 mcg by mouth daily.     Allergies:   Januvia [sitagliptin], Keflex [cephalexin], Levetiracetam, and Tape   Social History   Socioeconomic History   Marital status: Widowed    Spouse name: Not on file   Number of children: 1   Years of education: Not on file   Highest education level: Not on file  Occupational History   Not on file  Tobacco Use   Smoking status: Never   Smokeless tobacco: Never  Substance and Sexual Activity   Alcohol use: Yes    Comment: OCC   Drug use: No   Sexual activity: Not Currently  Other Topics Concern   Not on file  Social History Narrative   Lives home alone   Right handed   Drinks 3-4 cups caffeine daily   Social Determinants of Health   Financial Resource Strain: Not on file  Food  Insecurity: Not on file  Transportation Needs: Not on file  Physical Activity: Not on file  Stress: Not on file  Social Connections: Not on file     Family History: The patient's family history includes Congestive Heart Failure in her mother; Diabetes in her mother; Heart attack in her father; High blood pressure in her mother; Throat cancer in her father. There is no history of Sleep apnea. ROS:   Please see the history of present illness.    All other systems reviewed and are negative.  EKGs/Labs/Other Studies Reviewed:    The following studies were reviewed today:  Cardiac Studies & Procedures       ECHOCARDIOGRAM  ECHOCARDIOGRAM COMPLETE 06/13/2020  Narrative ECHOCARDIOGRAM REPORT    Patient Name:   [name]  Date of Exam: 06/13/2020 Medical Rec #:  [id number]  Height:       60.5 in Accession #:    [id number] Weight:       137.0 lb Date of Birth:  Oct 31, 1935  BSA:          1.599 m Patient Age:    85 years   BP:           140/68 mmHg Patient Gender: F          HR:           70 bpm. Exam Location:  [facility]  Procedure: 2D Echo  Indications:    H/O aortic valve replacement with tissue graft [Z95.4 (ICD-10-CM)]  History:        Patient has prior history of Echocardiogram examinations, most recent 03/19/2020. CHF, CAD, Signs/Symptoms:Syncope; Risk Factors:Diabetes. Aortic Valve: valve is present in the aortic position. Procedure Date: March 2011 with a #21 [facility] Ease bovine AVR.  Sonographer:    Louie Boston Referring Phys: [phone] Patrcia Schnepp J Khoa Opdahl  IMPRESSIONS   1. Left ventricular ejection fraction, by estimation, is 60 to 65%. The left ventricle has normal function. The left ventricle has no regional wall motion abnormalities. Left ventricular diastolic parameters are consistent with Grade I diastolic dysfunction (impaired relaxation). 2. Right ventricular systolic function is mildly reduced. The right ventricular size is normal. There is mildly elevated  pulmonary artery systolic pressure. 3. Left atrial size was mildly dilated. 4. The mitral valve is normal in structure. Mild mitral valve regurgitation. Mild mitral stenosis. Moderate mitral annular calcification. 5. The aortic valve is normal in structure. Aortic valve regurgitation is not visualized. There is a valve present in the aortic position. Procedure Date: March 2011 with a #21 [facility] Ease bovine AVR. Echo findings are consistent with normal structure and function of the aortic valve prosthesis. 6. The inferior vena cava is normal in size with greater than 50% respiratory variability, suggesting right atrial pressure of 3 mmHg.  FINDINGS Left Ventricle: Left ventricular ejection fraction, by estimation, is 60 to 65%. The left ventricle has normal function. The left ventricle has no regional wall motion abnormalities. The left ventricular internal cavity size was normal in size. There is no left ventricular hypertrophy. Left ventricular diastolic parameters are consistent with Grade I diastolic dysfunction (impaired relaxation). Indeterminate filling pressures.  Right Ventricle: The right ventricular size is normal. No increase in right ventricular wall thickness. Right ventricular systolic function is mildly reduced. There is mildly elevated pulmonary artery systolic pressure. The tricuspid regurgitant velocity is 2.67 m/s, and with an assumed right atrial pressure of 8 mmHg, the estimated right ventricular systolic pressure is 36.5 mmHg.  Left Atrium: Left atrial size was mildly dilated.  Right Atrium: Right atrial size was normal in size.  Pericardium: There is no evidence of pericardial effusion.  Mitral Valve: The mitral valve is normal in structure. There is mild thickening of the mitral valve leaflet(s). Moderate mitral annular calcification. Mild mitral valve regurgitation. Mild mitral valve stenosis. MV peak gradient, 11.7 mmHg. The mean mitral valve gradient is 4.0  mmHg.  Tricuspid Valve: The tricuspid valve is normal in structure. Tricuspid valve regurgitation is mild . No evidence of tricuspid stenosis.  Aortic Valve: The aortic valve is normal in structure. Aortic valve regurgitation is not visualized. Aortic valve mean gradient measures 17.0 mmHg. Aortic valve peak gradient measures 30.9 mmHg. Aortic valve area, by VTI measures 1.18 cm. There is a valve present in the aortic position. Procedure Date: March 2011 with a #21  [facility] Ease bovine AVR. Echo findings are consistent with normal structure and function of the aortic valve prosthesis.  Pulmonic Valve: The pulmonic valve was normal in structure. Pulmonic valve regurgitation is not visualized. No evidence of pulmonic stenosis.  Aorta: The aortic root and ascending aorta are structurally normal, with no evidence of dilitation.  Venous: A normal flow pattern is recorded from the right upper pulmonary vein. The inferior vena cava is normal in size with greater than 50% respiratory variability, suggesting right atrial pressure of 3 mmHg.  IAS/Shunts: No atrial level shunt detected by color flow Doppler.  Additional Comments: A device lead is visualized in the right ventricle.   LEFT VENTRICLE PLAX 2D LVIDd:         3.00 cm  Diastology LVIDs:         2.20 cm  LV e' medial:    3.62 cm/s LV PW:         1.90 cm  LV E/e' medial:  32.6 LV IVS:        2.10 cm  LV e' lateral:   3.87 cm/s LVOT diam:     1.90 cm  LV E/e' lateral: 30.5 LV SV:         73 LV SV Index:   45       2D Longitudinal Strain LVOT Area:     2.84 cm 2D Strain GLS Avg:     -15.6 %   RIGHT VENTRICLE            IVC RV S prime:     6.25 cm/s  IVC diam: 2.00 cm TAPSE (M-mode): 1.3 cm  LEFT ATRIUM             Index       RIGHT ATRIUM           Index LA diam:        3.80 cm 2.38 cm/m  RA Area:     13.70 cm LA Vol (A2C):   69.8 ml 43.65 ml/m RA Volume:   33.70 ml  21.07 ml/m LA Vol (A4C):   53.0 ml 33.14 ml/m LA  Biplane Vol: 63.6 ml 39.77 ml/m AORTIC VALVE AV Area (Vmax):    1.11 cm AV Area (Vmean):   1.16 cm AV Area (VTI):     1.18 cm AV Vmax:           278.00 cm/s AV Vmean:          194.000 cm/s AV VTI:            0.617 m AV Peak Grad:      30.9 mmHg AV Mean Grad:      17.0 mmHg LVOT Vmax:         109.00 cm/s LVOT Vmean:        79.600 cm/s LVOT VTI:          0.256 m LVOT/AV VTI ratio: 0.41  AORTA Ao Root diam: 2.00 cm Ao Asc diam:  3.10 cm  MITRAL VALVE                TRICUSPID VALVE MV Area (PHT): 3.11 cm     TR Peak grad:   28.5 mmHg MV Peak grad:  11.7 mmHg    TR Vmax:        267.00 cm/s MV Mean grad:  4.0 mmHg MV Vmax:       1.71 m/s     SHUNTS MV Vmean:      93.0 cm/s  Systemic VTI:  0.26 m MV Decel Time: 244 msec     Systemic Diam: 1.90 cm MV E velocity: 118.00 cm/s MV A velocity: 142.00 cm/s MV E/A ratio:  0.83  Norman Herrlich MD Electronically signed by Norman Herrlich MD Signature Date/Time: 06/13/2020/1:08:03 PM    Final    MONITORS  LONG TERM MONITOR (3-14 DAYS) 04/24/2019  Narrative A ZIO monitor was performed 3 days 6 hours beginning 04/06/2019 to assess palpitation.  The predominant rhythm is sinus first-degree AV block minimum average and maximum heart rates of 55, 70 and 94 bpm.  There were no pauses of 3 seconds or greater and no episodes of second or third-degree AV nodal block or sinus node exit block.  Ventricular ectopy was rare with isolated PVCs and couplets.  Supraventricular ectopy was rare there were no episodes of atrial fibrillation or flutter.  There were 17 runs of atrial premature contractions, SVT the longest and fastest 32 minutes 18 seconds at a rate of approximately 150 bpm.  There were no flutter waves visible.  On termination there is no prolonged bradycardia.  There were no triggered or diary events.   Conclusion:  SVT.            Physical Exam:    VS:  BP 118/64 (BP Location: Right Arm, Patient Position: Sitting, Cuff  Size: Normal)   Pulse 88   Ht 5' (1.524 m)   Wt 154 lb (69.9 kg)   SpO2 98%   BMI 30.08 kg/m     Wt Readings from Last 3 Encounters:  05/12/22 154 lb (69.9 kg)  09/10/21 153 lb 3.2 oz (69.5 kg)  04/22/21 150 lb 12.8 oz (68.4 kg)     GEN:  Well nourished, well developed in no acute distress HEENT: Normal NECK: No JVD; No carotid bruits LYMPHATICS: No lymphadenopathy CARDIAC: RRR, 1/6 ejection murmur across her aortic valve no aortic regurgitation RESPIRATORY:  Clear to auscultation without rales, wheezing or rhonchi  ABDOMEN: Soft, non-tender, non-distended MUSCULOSKELETAL:  No edema; No deformity  SKIN: Warm and dry NEUROLOGIC:  Alert and oriented x 3 PSYCHIATRIC:  Normal affect    Signed, Norman Herrlich, MD  05/12/2022 11:43 AM    [facility]

## 2022-05-12 ENCOUNTER — Ambulatory Visit: Payer: Medicare Other | Attending: Cardiology | Admitting: Cardiology

## 2022-05-12 ENCOUNTER — Encounter: Payer: Self-pay | Admitting: Cardiology

## 2022-05-12 VITALS — BP 118/64 | HR 88 | Ht 60.0 in | Wt 154.0 lb

## 2022-05-12 DIAGNOSIS — I342 Nonrheumatic mitral (valve) stenosis: Secondary | ICD-10-CM

## 2022-05-12 DIAGNOSIS — I130 Hypertensive heart and chronic kidney disease with heart failure and stage 1 through stage 4 chronic kidney disease, or unspecified chronic kidney disease: Secondary | ICD-10-CM

## 2022-05-12 DIAGNOSIS — Z951 Presence of aortocoronary bypass graft: Secondary | ICD-10-CM | POA: Diagnosis not present

## 2022-05-12 DIAGNOSIS — I442 Atrioventricular block, complete: Secondary | ICD-10-CM | POA: Diagnosis not present

## 2022-05-12 DIAGNOSIS — Z954 Presence of other heart-valve replacement: Secondary | ICD-10-CM

## 2022-05-12 DIAGNOSIS — I5032 Chronic diastolic (congestive) heart failure: Secondary | ICD-10-CM

## 2022-05-12 DIAGNOSIS — I484 Atypical atrial flutter: Secondary | ICD-10-CM

## 2022-05-12 DIAGNOSIS — Z950 Presence of cardiac pacemaker: Secondary | ICD-10-CM | POA: Diagnosis not present

## 2022-05-12 DIAGNOSIS — Z7901 Long term (current) use of anticoagulants: Secondary | ICD-10-CM

## 2022-05-12 DIAGNOSIS — E7800 Pure hypercholesterolemia, unspecified: Secondary | ICD-10-CM

## 2022-05-12 NOTE — Patient Instructions (Signed)
Medication Instructions:  Your physician recommends that you continue on your current medications as directed. Please refer to the Current Medication list given to you today.  *If you need a refill on your cardiac medications before your next appointment, please call your pharmacy*   Lab Work: None If you have labs (blood work) drawn today and your tests are completely normal, you will receive your results only by: MyChart Message (if you have MyChart) OR A paper copy in the mail If you have any lab test that is abnormal or we need to change your treatment, we will call you to review the results.   Testing/Procedures: None   Follow-Up: At [facility], you and your health needs are our priority.  As part of our continuing mission to provide you with exceptional heart care, we have created designated Provider Care Teams.  These Care Teams include your primary Cardiologist (physician) and Advanced Practice Providers (APPs -  Physician Assistants and Nurse Practitioners) who all work together to provide you with the care you need, when you need it.  We recommend signing up for the patient portal called "MyChart".  Sign up information is provided on this After Visit Summary.  MyChart is used to connect with patients for Virtual Visits (Telemedicine).  Patients are able to view lab/test results, encounter notes, upcoming appointments, etc.  Non-urgent messages can be sent to your provider as well.   To learn more about what you can do with MyChart, go to [email or web address].    Your next appointment:   6 month(s)  Provider:   Brian Munley, MD    Other Instructions None  

## 2022-06-01 NOTE — Progress Notes (Signed)
Remote pacemaker transmission.   

## 2022-07-20 DIAGNOSIS — E041 Nontoxic single thyroid nodule: Secondary | ICD-10-CM

## 2022-07-20 DIAGNOSIS — B0229 Other postherpetic nervous system involvement: Secondary | ICD-10-CM

## 2022-07-20 DIAGNOSIS — K5792 Diverticulitis of intestine, part unspecified, without perforation or abscess without bleeding: Secondary | ICD-10-CM | POA: Insufficient documentation

## 2022-07-20 DIAGNOSIS — M791 Myalgia, unspecified site: Secondary | ICD-10-CM | POA: Insufficient documentation

## 2022-07-20 DIAGNOSIS — M79604 Pain in right leg: Secondary | ICD-10-CM | POA: Insufficient documentation

## 2022-07-20 HISTORY — DX: Other postherpetic nervous system involvement: B02.29

## 2022-07-20 HISTORY — DX: Diverticulitis of intestine, part unspecified, without perforation or abscess without bleeding: K57.92

## 2022-07-20 HISTORY — DX: Pain in right leg: M79.604

## 2022-07-20 HISTORY — DX: Nontoxic single thyroid nodule: E04.1

## 2022-07-20 HISTORY — DX: Myalgia, unspecified site: M79.10

## 2022-08-03 ENCOUNTER — Ambulatory Visit (INDEPENDENT_AMBULATORY_CARE_PROVIDER_SITE_OTHER): Payer: Medicare Other

## 2022-08-03 DIAGNOSIS — I442 Atrioventricular block, complete: Secondary | ICD-10-CM | POA: Diagnosis not present

## 2022-08-04 LAB — CUP PACEART REMOTE DEVICE CHECK
Battery Remaining Longevity: 121 mo
Battery Voltage: 3.01 V
Brady Statistic AP VP Percent: 41.05 %
Brady Statistic AP VS Percent: 0 %
Brady Statistic AS VP Percent: 58.86 %
Brady Statistic AS VS Percent: 0.09 %
Brady Statistic RA Percent Paced: 40.98 %
Brady Statistic RV Percent Paced: 99.91 %
Date Time Interrogation Session: 20240720193123
Implantable Lead Connection Status: 753985
Implantable Lead Connection Status: 753985
Implantable Lead Implant Date: 20220331
Implantable Lead Implant Date: 20220331
Implantable Lead Location: 753859
Implantable Lead Location: 753860
Implantable Lead Model: 5076
Implantable Lead Model: 5076
Implantable Pulse Generator Implant Date: 20220331
Lead Channel Impedance Value: 285 Ohm
Lead Channel Impedance Value: 342 Ohm
Lead Channel Impedance Value: 456 Ohm
Lead Channel Impedance Value: 513 Ohm
Lead Channel Pacing Threshold Amplitude: 0.625 V
Lead Channel Pacing Threshold Amplitude: 0.75 V
Lead Channel Pacing Threshold Pulse Width: 0.4 ms
Lead Channel Pacing Threshold Pulse Width: 0.4 ms
Lead Channel Sensing Intrinsic Amplitude: 1.875 mV
Lead Channel Sensing Intrinsic Amplitude: 1.875 mV
Lead Channel Sensing Intrinsic Amplitude: 12.5 mV
Lead Channel Sensing Intrinsic Amplitude: 12.5 mV
Lead Channel Setting Pacing Amplitude: 1.5 V
Lead Channel Setting Pacing Amplitude: 2 V
Lead Channel Setting Pacing Pulse Width: 0.4 ms
Lead Channel Setting Sensing Sensitivity: 1.2 mV
Zone Setting Status: 755011
Zone Setting Status: 755011

## 2022-08-12 ENCOUNTER — Telehealth: Payer: Self-pay | Admitting: *Deleted

## 2022-08-12 NOTE — Telephone Encounter (Signed)
Pt returned my call. Advised of transmission findings/recommendation. Pt reports she is taking Toprol 12.5 mg once daily in the mornings. PCP restarted it back in May, but pt is not sure reasoning. Her BP this morning at doctors office was 105/68. Will forward back to MD for review/updated advisement. Pt aware it will be next week before following up with her on this matter. Patient verbalized understanding and agreeable to plan.

## 2022-08-12 NOTE — Telephone Encounter (Signed)
-----   Message from Will [facility] sent at 08/08/2022  3:56 PM EDT ----- Abnormal device interrogation reviewed.  Lead parameters and battery status stable.  VT episode. Start toprol xl 50 mg

## 2022-08-12 NOTE — Telephone Encounter (Signed)
Left message to call back  

## 2022-08-19 NOTE — Telephone Encounter (Signed)
Pt informed of MD recommendation. She is not comfortable increasing to 50 mg. She would like to start w/ 25 mg to see how she responds first. Aware I will follow up in several weeks to a month to see how she is doing. However advised to call office if she has problems after increasing the medication. She does not want a Rx sent yet. Patient verbalized understanding and agreeable to plan.

## 2022-08-19 NOTE — Progress Notes (Signed)
Remote pacemaker transmission.   

## 2022-09-30 ENCOUNTER — Other Ambulatory Visit: Payer: Self-pay | Admitting: Otolaryngology

## 2022-09-30 DIAGNOSIS — E041 Nontoxic single thyroid nodule: Secondary | ICD-10-CM

## 2022-10-22 MED ORDER — METOPROLOL SUCCINATE ER 25 MG PO TB24: 25.0000 mg | ORAL_TABLET | Freq: Every day | ORAL | 1 refills | Status: DC

## 2022-10-22 NOTE — Telephone Encounter (Signed)
Pt doing well on Toprol 25 mg once daily. She does not wish to increase this unless needed after next transmission. Sent Rx to [facility] per pt request. She is agreeable to plan.

## 2022-10-22 NOTE — Addendum Note (Signed)
Addended by: Baird Lyons on: 10/22/2022 01:10 PM   Modules accepted: Orders

## 2022-11-02 ENCOUNTER — Ambulatory Visit (INDEPENDENT_AMBULATORY_CARE_PROVIDER_SITE_OTHER): Payer: Medicare Other

## 2022-11-02 DIAGNOSIS — I442 Atrioventricular block, complete: Secondary | ICD-10-CM | POA: Diagnosis not present

## 2022-11-02 DIAGNOSIS — I484 Atypical atrial flutter: Secondary | ICD-10-CM

## 2022-11-03 LAB — CUP PACEART REMOTE DEVICE CHECK
Battery Remaining Longevity: 118 mo
Battery Voltage: 3.01 V
Brady Statistic AP VP Percent: 53.9 %
Brady Statistic AP VS Percent: 0 %
Brady Statistic AS VP Percent: 46.08 %
Brady Statistic AS VS Percent: 0.02 %
Brady Statistic RA Percent Paced: 53.73 %
Brady Statistic RV Percent Paced: 99.98 %
Date Time Interrogation Session: 20241022054531
Implantable Lead Connection Status: 753985
Implantable Lead Connection Status: 753985
Implantable Lead Implant Date: 20220331
Implantable Lead Implant Date: 20220331
Implantable Lead Location: 753859
Implantable Lead Location: 753860
Implantable Lead Model: 5076
Implantable Lead Model: 5076
Implantable Pulse Generator Implant Date: 20220331
Lead Channel Impedance Value: 285 Ohm
Lead Channel Impedance Value: 380 Ohm
Lead Channel Impedance Value: 475 Ohm
Lead Channel Impedance Value: 532 Ohm
Lead Channel Pacing Threshold Amplitude: 0.5 V
Lead Channel Pacing Threshold Amplitude: 0.625 V
Lead Channel Pacing Threshold Pulse Width: 0.4 ms
Lead Channel Pacing Threshold Pulse Width: 0.4 ms
Lead Channel Sensing Intrinsic Amplitude: 1.875 mV
Lead Channel Sensing Intrinsic Amplitude: 1.875 mV
Lead Channel Sensing Intrinsic Amplitude: 13.5 mV
Lead Channel Sensing Intrinsic Amplitude: 13.5 mV
Lead Channel Setting Pacing Amplitude: 1.5 V
Lead Channel Setting Pacing Amplitude: 2 V
Lead Channel Setting Pacing Pulse Width: 0.4 ms
Lead Channel Setting Sensing Sensitivity: 1.2 mV
Zone Setting Status: 755011
Zone Setting Status: 755011

## 2022-11-16 NOTE — Progress Notes (Unsigned)
Cardiology Office Note:    Date:  11/17/2022   ID:  [name], DOB 1935/10/18, MRN [id number]  PCP:  Raynelle Jan., MD  Cardiologist:  Norman Herrlich, MD    Referring MD: Raynelle Jan., MD    ASSESSMENT:    1. Heart block AV complete (HCC)   2. Cardiac pacemaker in situ   3. Atypical atrial flutter (HCC)   4. Chronic anticoagulation   5. H/O aortic valve replacement with tissue graft   6. Hx of CABG   7. Nonrheumatic mitral valve stenosis   8. Hypertensive heart and chronic kidney disease with chronic diastolic congestive heart failure, unspecified CKD stage (HCC)   9. High cholesterol    PLAN:    In order of problems listed above:  Marlia continues to do well from a cardiology perspective she has heart block pacemaker normal f function followed in our device clinic. Currently not anticoagulated following subdural hematoma Stable AVR function Stable CAD no anginal discomfort continue medical therapy with aspirin or beta-blocker and her current statin LDL at target She has mild calcific mitral valve disease with stenosis today to follow-up echocardiogram in the future Heart failure is nicely compensated continue her loop diuretic and ARB for hypertension   Next appointment: 6 months   Medication Adjustments/Labs and Tests Ordered: Current medicines are reviewed at length with the patient today.  Concerns regarding medicines are outlined above.  Orders Placed This Encounter  Procedures   EKG 12-Lead   No orders of the defined types were placed in this encounter.    History of Present Illness:    [name] is a 87 y.o. female with a hx of atrial flutter with chronic anticoagulation permanent pacemaker CAD with CABG and bioprosthetic aortic valve replacement 2011 hypertensive heart and chronic kidney disease with heart failure hyperlipidemia mild calcific mitral stenosis and previous subarachnoid hemorrhage last seen 05/12/2022.  Labs 63[address] the thousand  24: Hemoglobin 12.5 platelets 190,000 cholesterol 150 LDL 88 triglycerides 90 HDL 44 non-HDL cholesterol 100 Compliance with diet, lifestyle and medications: Yes  She feels well and is doing well No angina edema shortness of breath palpitation or syncope Greatest impediment and she is losing her vision for macular degeneration She tolerates her statin without muscle pain or weakness lipids are at target and her pacemaker is following her device clinic last checked 1020 02/12/2022 with normal device parameters and battery and lead.  Projected battery life 10 years Past Medical History:  Diagnosis Date   Age related osteoporosis 08/26/2015   Anemia due to vitamin B12 deficiency 08/26/2015   Angina pectoris (HCC) 12/30/2014   Aortic atherosclerosis (HCC) 02/14/2022   Atrial flutter, paroxysmal (HCC) 08/27/2020   Beat, premature ventricular 12/30/2014   Benign essential hypertension 08/26/2015   CAD in native artery 12/30/2014   Cardiorenal disease 12/30/2014   Chronic diastolic heart failure (HCC) 12/30/2014   Chronic LLQ pain 05/26/2017   CKD (chronic kidney disease) stage 3, GFR 30-59 ml/min (HCC) 12/30/2014   Controlled type 2 diabetes mellitus with mild nonproliferative retinopathy, without long-term current use of insulin (HCC) 03/25/2019   Diabetes (HCC)    Diverticulitis 07/20/2022   Diverticulosis 02/13/2020   Drug-induced chronic gout of ankle 11/23/2018   Dyslipidemia 03/20/2018   Generalized seizure (HCC) 06/11/2020   Formatting of this note might be different from the original.  Last 04/05/20     GERD (gastroesophageal reflux disease) 08/26/2015   Gout 11/27/2019   H/O aortic valve replacement with tissue  graft 12/30/2014   Overview:  S/P AVR with tissue valve, March 2011, #21 [facility] Ease bovine and CABG   High blood pressure    High cholesterol    History of diverticulosis 05/26/2017   Hypertensive heart and chronic kidney disease with heart failure and stage 1  through stage 4 chronic kidney disease, or chronic kidney disease (HCC) 12/30/2014   Incarcerated incisional hernia 05/14/2015   Left ventricular hypertrophy 08/26/2015   Myalgia 07/20/2022   Obesity 08/26/2015   Occlusion and stenosis of bilateral carotid arteries 01/12/2020   OSA (obstructive sleep apnea) 08/26/2015   Other amnesia 03/18/2020   Pain in both lower extremities 07/20/2022   Postherpetic neuralgia 07/20/2022   Presence of aortocoronary bypass graft 01/11/2018   Presence of cardiac pacemaker 04/29/2020   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 01/12/2020   Rheumatic heart failure (HCC) 04/29/2020   Scalp laceration    Secondary hypersomnolence disorder 08/26/2015   Seizure disorder (HCC) 08/27/2020   Subarachnoid bleed (HCC) 03/19/2020   Subarachnoid hemorrhage (HCC) 03/19/2020   Subarachnoid hemorrhage following injury, concussion (HCC) 04/11/2020   Subcutaneous hematoma    Subdural hematoma (HCC) 03/18/2020   Syncope    Thyroid nodule 07/20/2022   Trigger finger of right thumb 02/04/2021   Type 2 diabetes mellitus with peripheral angiopathy (HCC) 04/24/2015   Type 2 diabetes mellitus without complication, without long-term current use of insulin (HCC)    Wellness examination 04/10/2020   Last Assessment & Plan:   Formatting of this note might be different from the original.  Normal Device Function     Adjusted Outputs to Maximize Safety and Longevity  Ongoing atrial tach/flutter.  I was highly tempted to try to interface with the rhythm to pace terminated however the patient is not anticoagulated and is not a good candidate for anticoagulation.  -  Follow up 1 year unless she has     Current Medications: Current Meds  Medication Sig   allopurinol (ZYLOPRIM) 100 MG tablet Take 100 mg by mouth 2 (two) times daily.   aspirin EC 81 MG tablet Take 81 mg by mouth daily. Swallow whole.   cholecalciferol (VITAMIN D3) 25 MCG (1000 UNIT) tablet Take 1,000  Units by mouth daily.   colchicine 0.6 MG tablet Take 0.6 mg by mouth daily as needed (Gout).   EPIPEN 2-PAK 0.3 MG/0.3ML SOAJ injection Inject 0.3 mg into the muscle as needed for anaphylaxis.   ferrous sulfate 325 (65 FE) MG tablet Take 325 mg by mouth daily.    losartan (COZAAR) 25 MG tablet Take 12.5 mg by mouth daily.   MAGNESIUM OXIDE PO Take 30 mg by mouth daily.   metoprolol succinate (TOPROL XL) 25 MG 24 hr tablet Take 1 tablet (25 mg total) by mouth at bedtime.   Multiple Vitamins-Minerals (MULTIVITAMIN ADULTS) TABS Take 1 tablet by mouth daily.   nitroGLYCERIN (NITROSTAT) 0.4 MG SL tablet Place 0.4 mg under the tongue as needed for chest pain.   Omega-3 1000 MG CAPS Take 1,000 mg by mouth 2 (two) times daily.   potassium chloride (KLOR-CON) 10 MEQ tablet Take 10 mEq by mouth every evening.   pravastatin (PRAVACHOL) 80 MG tablet Take 80 mg by mouth daily.   spironolactone (ALDACTONE) 25 MG tablet Take 12.5 mg by mouth daily.   tobramycin (TOBREX) 0.3 % ophthalmic solution Place 1 drop into both eyes See admin instructions. Take every 3 months before and after shot in eye   torsemide (DEMADEX) 10 MG  tablet Take 1 tablet (10 mg total) by mouth daily.   vitamin B-12 (CYANOCOBALAMIN) 1000 MCG tablet Take 1,000 mcg by mouth daily.      EKGs/Labs/Other Studies Reviewed:     Physical Exam:    VS:  BP 112/62 (BP Location: Left Arm, Patient Position: Sitting, Cuff Size: Normal)   Pulse 73   Wt 151 lb (68.5 kg)   SpO2 96%   BMI 29.49 kg/m     Wt Readings from Last 3 Encounters:  11/17/22 151 lb (68.5 kg)  05/12/22 154 lb (69.9 kg)  09/10/21 153 lb 3.2 oz (69.5 kg)     GEN:  Well nourished, well developed in no acute distress HEENT: Normal NECK: No JVD; No carotid bruits LYMPHATICS: No lymphadenopathy CARDIAC: 1 of 6 flow murmur RRR, no murmurs, rubs, gallops RESPIRATORY:  Clear to auscultation without rales, wheezing or rhonchi  ABDOMEN: Soft, non-tender,  non-distended MUSCULOSKELETAL:  No edema; No deformity  SKIN: Warm and dry NEUROLOGIC:  Alert and oriented x 3 PSYCHIATRIC:  Normal affect    Signed, Norman Herrlich, MD  11/17/2022 1:08 PM    [facility]

## 2022-11-17 ENCOUNTER — Ambulatory Visit: Payer: Medicare Other | Attending: Cardiology | Admitting: Cardiology

## 2022-11-17 ENCOUNTER — Encounter: Payer: Self-pay | Admitting: Cardiology

## 2022-11-17 VITALS — BP 112/62 | HR 73 | Wt 151.0 lb

## 2022-11-17 DIAGNOSIS — Z950 Presence of cardiac pacemaker: Secondary | ICD-10-CM | POA: Diagnosis not present

## 2022-11-17 DIAGNOSIS — E7800 Pure hypercholesterolemia, unspecified: Secondary | ICD-10-CM

## 2022-11-17 DIAGNOSIS — I484 Atypical atrial flutter: Secondary | ICD-10-CM

## 2022-11-17 DIAGNOSIS — Z7901 Long term (current) use of anticoagulants: Secondary | ICD-10-CM | POA: Diagnosis not present

## 2022-11-17 DIAGNOSIS — I442 Atrioventricular block, complete: Secondary | ICD-10-CM | POA: Diagnosis not present

## 2022-11-17 DIAGNOSIS — I342 Nonrheumatic mitral (valve) stenosis: Secondary | ICD-10-CM

## 2022-11-17 DIAGNOSIS — I5032 Chronic diastolic (congestive) heart failure: Secondary | ICD-10-CM

## 2022-11-17 DIAGNOSIS — I130 Hypertensive heart and chronic kidney disease with heart failure and stage 1 through stage 4 chronic kidney disease, or unspecified chronic kidney disease: Secondary | ICD-10-CM

## 2022-11-17 DIAGNOSIS — Z954 Presence of other heart-valve replacement: Secondary | ICD-10-CM

## 2022-11-17 DIAGNOSIS — Z951 Presence of aortocoronary bypass graft: Secondary | ICD-10-CM

## 2022-11-17 NOTE — Patient Instructions (Signed)

## 2022-11-19 NOTE — Progress Notes (Signed)
Remote pacemaker transmission.   

## 2022-11-29 ENCOUNTER — Ambulatory Visit
Admission: RE | Admit: 2022-11-29 | Discharge: 2022-11-29 | Disposition: A | Discharge status: Home / Self Care | Payer: Medicare Other | Source: Ambulatory Visit | Attending: Otolaryngology | Admitting: Otolaryngology

## 2022-11-29 ENCOUNTER — Other Ambulatory Visit (HOSPITAL_COMMUNITY)
Admission: RE | Admit: 2022-11-29 | Discharge: 2022-11-29 | Disposition: A | Discharge status: Home / Self Care | Payer: Medicare Other | Source: Ambulatory Visit | Attending: Otolaryngology | Admitting: Otolaryngology

## 2022-11-29 DIAGNOSIS — E041 Nontoxic single thyroid nodule: Secondary | ICD-10-CM

## 2022-11-30 LAB — CYTOLOGY - NON PAP

## 2022-12-02 ENCOUNTER — Other Ambulatory Visit: Payer: Self-pay | Admitting: Cardiology

## 2023-01-06 ENCOUNTER — Other Ambulatory Visit: Payer: Self-pay | Admitting: Cardiology

## 2023-01-30 ENCOUNTER — Other Ambulatory Visit: Payer: Self-pay | Admitting: Cardiology

## 2023-02-01 ENCOUNTER — Ambulatory Visit (INDEPENDENT_AMBULATORY_CARE_PROVIDER_SITE_OTHER): Payer: Medicare Other

## 2023-02-01 DIAGNOSIS — I484 Atypical atrial flutter: Secondary | ICD-10-CM | POA: Diagnosis not present

## 2023-02-01 DIAGNOSIS — I442 Atrioventricular block, complete: Secondary | ICD-10-CM

## 2023-02-01 LAB — CUP PACEART REMOTE DEVICE CHECK
Battery Remaining Longevity: 115 mo
Battery Voltage: 3 V
Brady Statistic AP VP Percent: 64.79 %
Brady Statistic AP VS Percent: 0.01 %
Brady Statistic AS VP Percent: 35.16 %
Brady Statistic AS VS Percent: 0.04 %
Brady Statistic RA Percent Paced: 64.78 %
Brady Statistic RV Percent Paced: 99.96 %
Date Time Interrogation Session: 20250120220629
Implantable Lead Connection Status: 753985
Implantable Lead Connection Status: 753985
Implantable Lead Implant Date: 20220331
Implantable Lead Implant Date: 20220331
Implantable Lead Location: 753859
Implantable Lead Location: 753860
Implantable Lead Model: 5076
Implantable Lead Model: 5076
Implantable Pulse Generator Implant Date: 20220331
Lead Channel Impedance Value: 285 Ohm
Lead Channel Impedance Value: 380 Ohm
Lead Channel Impedance Value: 475 Ohm
Lead Channel Impedance Value: 532 Ohm
Lead Channel Pacing Threshold Amplitude: 0.5 V
Lead Channel Pacing Threshold Amplitude: 0.75 V
Lead Channel Pacing Threshold Pulse Width: 0.4 ms
Lead Channel Pacing Threshold Pulse Width: 0.4 ms
Lead Channel Sensing Intrinsic Amplitude: 1.625 mV
Lead Channel Sensing Intrinsic Amplitude: 1.625 mV
Lead Channel Sensing Intrinsic Amplitude: 13.5 mV
Lead Channel Sensing Intrinsic Amplitude: 13.5 mV
Lead Channel Setting Pacing Amplitude: 1.5 V
Lead Channel Setting Pacing Amplitude: 2 V
Lead Channel Setting Pacing Pulse Width: 0.4 ms
Lead Channel Setting Sensing Sensitivity: 1.2 mV
Zone Setting Status: 755011
Zone Setting Status: 755011

## 2023-03-15 NOTE — Progress Notes (Signed)
 Remote pacemaker transmission.

## 2023-04-19 DIAGNOSIS — C4492 Squamous cell carcinoma of skin, unspecified: Secondary | ICD-10-CM | POA: Insufficient documentation

## 2023-04-19 HISTORY — DX: Squamous cell carcinoma of skin, unspecified: C44.92

## 2023-04-24 DIAGNOSIS — E1142 Type 2 diabetes mellitus with diabetic polyneuropathy: Secondary | ICD-10-CM

## 2023-04-24 HISTORY — DX: Type 2 diabetes mellitus with diabetic polyneuropathy: E11.42

## 2023-05-03 ENCOUNTER — Ambulatory Visit: Payer: Medicare Other

## 2023-05-03 DIAGNOSIS — I442 Atrioventricular block, complete: Secondary | ICD-10-CM | POA: Diagnosis not present

## 2023-05-04 LAB — CUP PACEART REMOTE DEVICE CHECK
Battery Remaining Longevity: 113 mo
Battery Voltage: 3 V
Brady Statistic AP VP Percent: 54.62 %
Brady Statistic AP VS Percent: 0 %
Brady Statistic AS VP Percent: 45.35 %
Brady Statistic AS VS Percent: 0.02 %
Brady Statistic RA Percent Paced: 54.46 %
Brady Statistic RV Percent Paced: 99.98 %
Date Time Interrogation Session: 20250420202620
Implantable Lead Connection Status: 753985
Implantable Lead Connection Status: 753985
Implantable Lead Implant Date: 20220331
Implantable Lead Implant Date: 20220331
Implantable Lead Location: 753859
Implantable Lead Location: 753860
Implantable Lead Model: 5076
Implantable Lead Model: 5076
Implantable Pulse Generator Implant Date: 20220331
Lead Channel Impedance Value: 285 Ohm
Lead Channel Impedance Value: 361 Ohm
Lead Channel Impedance Value: 513 Ohm
Lead Channel Impedance Value: 532 Ohm
Lead Channel Pacing Threshold Amplitude: 0.5 V
Lead Channel Pacing Threshold Amplitude: 0.75 V
Lead Channel Pacing Threshold Pulse Width: 0.4 ms
Lead Channel Pacing Threshold Pulse Width: 0.4 ms
Lead Channel Sensing Intrinsic Amplitude: 1.75 mV
Lead Channel Sensing Intrinsic Amplitude: 1.75 mV
Lead Channel Sensing Intrinsic Amplitude: 17.375 mV
Lead Channel Sensing Intrinsic Amplitude: 17.375 mV
Lead Channel Setting Pacing Amplitude: 1.5 V
Lead Channel Setting Pacing Amplitude: 2 V
Lead Channel Setting Pacing Pulse Width: 0.4 ms
Lead Channel Setting Sensing Sensitivity: 1.2 mV
Zone Setting Status: 755011
Zone Setting Status: 755011

## 2023-05-17 ENCOUNTER — Encounter: Payer: Self-pay | Admitting: Cardiology

## 2023-05-17 NOTE — Progress Notes (Signed)
 " Cardiology Office Note:  .   Date:  05/18/2023  ID:  [name], DOB July 04, 1935, MRN [id number] PCP: Ryan Powell HERO., MD  [facility] Providers Cardiologist:  Redell Leiter, MD Electrophysiologist:  OLE ONEIDA HOLTS, MD    History of Present Illness: Gabrielle   RICHETTA Sanchez is a 88 y.o. female with a past medical history of CAD s/p CABG, HTN, atrial flutter not on DOAC, PVCs, HFpEF, complete heart block with placement of PPM, carotid artery stenosis, OSA, GERD, DM2, seizure disorder, history of subarachnoid hemorrhage and subdural hematoma.  05/04/2023 device check normal device functioning, 1 episode of atrial flutter/fibrillation 06/13/2020 echo EF 60 to 65%, grade 1 DD, mildly elevated PASP, biatrial enlargement, mild MR with moderate annular calcification 03/11/2020 ppm implantation 03/19/2020 carotid duplex mild bilateral carotid artery stenosis 04/06/2019 monitor average heart rate 70 bpm, 17 runs of APC, SVT occurred 2011 AVR with 21# Edwards bovine valve  2011 she underwent CABG replacement of her aortic valve with a bioprosthetic AVR.  In 2022 she was having episodes of bradycardia and near syncope, ultimately underwent a dual-chamber permanent pacemaker on 04/10/2020. Most recently evaluated by Dr. Leiter on 11/17/2022, stable from a cardiac perspective, no changes were made to plan of care she was advised follow-up in 6 months.  She presents today for follow-up, she has been doing stable from a cardiac perspective since she was last evaluated in our office.  Unfortunately she has been dealing with recurrent episodes of shingles as well as shingles. She denies chest pain, palpitations, dyspnea, pnd, orthopnea, n, v, dizziness, syncope, edema, weight gain, or early satiety.   ROS: Review of Systems  All other systems reviewed and are negative.    Studies Reviewed: Gabrielle   EKG Interpretation Date/Time:  Wednesday May 18 2023 13:24:58 EDT Ventricular Rate:  79 PR Interval:  198 QRS  Duration:  146 QT Interval:  418 QTC Calculation: 479 R Axis:   -57  Text Interpretation: AV dual-paced rhythm When compared with ECG of 13-Mar-2009 07:58, Electronic ventricular pacemaker has replaced Sinus rhythm Confirmed by Carlin Nest [phone]) on 05/18/2023 1:29:03 PM    Cardiac Studies & Procedures   ______________________________________________________________________________________________     ECHOCARDIOGRAM  ECHOCARDIOGRAM COMPLETE 06/13/2020  Narrative ECHOCARDIOGRAM REPORT    Patient Name:   [name]  Date of Exam: 06/13/2020 Medical Rec #:  [id number]  Height:       60.5 in Accession #:    [id number] Weight:       137.0 lb Date of Birth:  11-May-1935  BSA:          1.599 m Patient Age:    85 years   BP:           140/68 mmHg Patient Gender: F          HR:           70 bpm. Exam Location:  [place]  Procedure: 2D Echo  Indications:    H/O aortic valve replacement with tissue graft [Z95.4 (ICD-10-CM)]  History:        Patient has prior history of Echocardiogram examinations, most recent 03/19/2020. CHF, CAD, Signs/Symptoms:Syncope; Risk Factors:Diabetes. Aortic Valve: valve is present in the aortic position. Procedure Date: March 2011 with a #21 [facility] Ease bovine AVR.  Sonographer:    Lynwood Silvas Referring Phys: [phone] BRIAN J MUNLEY  IMPRESSIONS   1. Left ventricular ejection fraction, by estimation, is 60 to 65%. The left ventricle has normal function. The  left ventricle has no regional wall motion abnormalities. Left ventricular diastolic parameters are consistent with Grade I diastolic dysfunction (impaired relaxation). 2. Right ventricular systolic function is mildly reduced. The right ventricular size is normal. There is mildly elevated pulmonary artery systolic pressure. 3. Left atrial size was mildly dilated. 4. The mitral valve is normal in structure. Mild mitral valve regurgitation. Mild mitral stenosis. Moderate mitral annular  calcification. 5. The aortic valve is normal in structure. Aortic valve regurgitation is not visualized. There is a valve present in the aortic position. Procedure Date: March 2011 with a #21 [facility] Ease bovine AVR. Echo findings are consistent with normal structure and function of the aortic valve prosthesis. 6. The inferior vena cava is normal in size with greater than 50% respiratory variability, suggesting right atrial pressure of 3 mmHg.  FINDINGS Left Ventricle: Left ventricular ejection fraction, by estimation, is 60 to 65%. The left ventricle has normal function. The left ventricle has no regional wall motion abnormalities. The left ventricular internal cavity size was normal in size. There is no left ventricular hypertrophy. Left ventricular diastolic parameters are consistent with Grade I diastolic dysfunction (impaired relaxation). Indeterminate filling pressures.  Right Ventricle: The right ventricular size is normal. No increase in right ventricular wall thickness. Right ventricular systolic function is mildly reduced. There is mildly elevated pulmonary artery systolic pressure. The tricuspid regurgitant velocity is 2.67 m/s, and with an assumed right atrial pressure of 8 mmHg, the estimated right ventricular systolic pressure is 36.5 mmHg.  Left Atrium: Left atrial size was mildly dilated.  Right Atrium: Right atrial size was normal in size.  Pericardium: There is no evidence of pericardial effusion.  Mitral Valve: The mitral valve is normal in structure. There is mild thickening of the mitral valve leaflet(s). Moderate mitral annular calcification. Mild mitral valve regurgitation. Mild mitral valve stenosis. MV peak gradient, 11.7 mmHg. The mean mitral valve gradient is 4.0 mmHg.  Tricuspid Valve: The tricuspid valve is normal in structure. Tricuspid valve regurgitation is mild . No evidence of tricuspid stenosis.  Aortic Valve: The aortic valve is normal in structure.  Aortic valve regurgitation is not visualized. Aortic valve mean gradient measures 17.0 mmHg. Aortic valve peak gradient measures 30.9 mmHg. Aortic valve area, by VTI measures 1.18 cm. There is a valve present in the aortic position. Procedure Date: March 2011 with a #21 [facility] Ease bovine AVR. Echo findings are consistent with normal structure and function of the aortic valve prosthesis.  Pulmonic Valve: The pulmonic valve was normal in structure. Pulmonic valve regurgitation is not visualized. No evidence of pulmonic stenosis.  Aorta: The aortic root and ascending aorta are structurally normal, with no evidence of dilitation.  Venous: A normal flow pattern is recorded from the right upper pulmonary vein. The inferior vena cava is normal in size with greater than 50% respiratory variability, suggesting right atrial pressure of 3 mmHg.  IAS/Shunts: No atrial level shunt detected by color flow Doppler.  Additional Comments: A device lead is visualized in the right ventricle.   LEFT VENTRICLE PLAX 2D LVIDd:         3.00 cm  Diastology LVIDs:         2.20 cm  LV e' medial:    3.62 cm/s LV PW:         1.90 cm  LV E/e' medial:  32.6 LV IVS:        2.10 cm  LV e' lateral:   3.87 cm/s LVOT diam:  1.90 cm  LV E/e' lateral: 30.5 LV SV:         73 LV SV Index:   45       2D Longitudinal Strain LVOT Area:     2.84 cm 2D Strain GLS Avg:     -15.6 %   RIGHT VENTRICLE            IVC RV S prime:     6.25 cm/s  IVC diam: 2.00 cm TAPSE (M-mode): 1.3 cm  LEFT ATRIUM             Index       RIGHT ATRIUM           Index LA diam:        3.80 cm 2.38 cm/m  RA Area:     13.70 cm LA Vol (A2C):   69.8 ml 43.65 ml/m RA Volume:   33.70 ml  21.07 ml/m LA Vol (A4C):   53.0 ml 33.14 ml/m LA Biplane Vol: 63.6 ml 39.77 ml/m AORTIC VALVE AV Area (Vmax):    1.11 cm AV Area (Vmean):   1.16 cm AV Area (VTI):     1.18 cm AV Vmax:           278.00 cm/s AV Vmean:          194.000 cm/s AV VTI:             0.617 m AV Peak Grad:      30.9 mmHg AV Mean Grad:      17.0 mmHg LVOT Vmax:         109.00 cm/s LVOT Vmean:        79.600 cm/s LVOT VTI:          0.256 m LVOT/AV VTI ratio: 0.41  AORTA Ao Root diam: 2.00 cm Ao Asc diam:  3.10 cm  MITRAL VALVE                TRICUSPID VALVE MV Area (PHT): 3.11 cm     TR Peak grad:   28.5 mmHg MV Peak grad:  11.7 mmHg    TR Vmax:        267.00 cm/s MV Mean grad:  4.0 mmHg MV Vmax:       1.71 m/s     SHUNTS MV Vmean:      93.0 cm/s    Systemic VTI:  0.26 m MV Decel Time: 244 msec     Systemic Diam: 1.90 cm MV E velocity: 118.00 cm/s MV A velocity: 142.00 cm/s MV E/A ratio:  0.83  Redell Leiter MD Electronically signed by Redell Leiter MD Signature Date/Time: 06/13/2020/1:08:03 PM    Final    MONITORS  LONG TERM MONITOR (3-14 DAYS) 04/19/2019  Narrative A ZIO monitor was performed 3 days 6 hours beginning 04/06/2019 to assess palpitation.  The predominant rhythm is sinus first-degree AV block minimum average and maximum heart rates of 55, 70 and 94 bpm.  There were no pauses of 3 seconds or greater and no episodes of second or third-degree AV nodal block or sinus node exit block.  Ventricular ectopy was rare with isolated PVCs and couplets.  Supraventricular ectopy was rare there were no episodes of atrial fibrillation or flutter.  There were 17 runs of atrial premature contractions, SVT the longest and fastest 32 minutes 18 seconds at a rate of approximately 150 bpm.  There were no flutter waves visible.  On termination there is no prolonged bradycardia.  There were no triggered  or diary events.   Conclusion:  SVT.       ______________________________________________________________________________________________      Risk Assessment/Calculations:    CHA2DS2-VASc Score = 7   This indicates a 11.2% annual risk of stroke. The patient's score is based upon: CHF History: 1 HTN History: 1 Diabetes History: 1 Stroke  History: 0 Vascular Disease History: 1 Age Score: 2 Gender Score: 1            Physical Exam:   VS:  BP (!) 122/58   Pulse 79   Ht 4' 11 (1.499 m)   Wt 150 lb 6.4 oz (68.2 kg)   SpO2 97%   BMI 30.38 kg/m    Wt Readings from Last 3 Encounters:  05/18/23 150 lb 6.4 oz (68.2 kg)  11/17/22 151 lb (68.5 kg)  05/12/22 154 lb (69.9 kg)    GEN: Well nourished, well developed in no acute distress NECK: No JVD; No carotid bruits CARDIAC: RRR, no murmurs, rubs, gallops RESPIRATORY:  Clear to auscultation without rales, wheezing or rhonchi  ABDOMEN: Soft, non-tender, non-distended EXTREMITIES:  No edema; No deformity   ASSESSMENT AND PLAN: .   Complete heart block/presence of pacemaker - follows with EP, most recent device check was stable.  Atypical atrial flutter-episode of atrial fibrillation/flutter on most recent device check however she is not anticoagulated secondary to history of hemorrhagic stroke as well as subdural hematoma.  Continue metoprolol  25 mg daily.  CAD-s/p CABG. Stable with no anginal symptoms. No indication for ischemic evaluation.  Continue aspirin 81 mg daily, continue metoprolol  25 mg daily, continue pravastatin  80 mg daily.  History of aortic valve replacement-most recent echo in 2022 revealed device is functioning normally.  She does have SBE arranged although it is not on her medication list she is aware she needs it prior to any dental procedures.       Dispo: Follow-up in 6 months.  Signed, Delon JAYSON Hoover, NP  "

## 2023-05-18 ENCOUNTER — Ambulatory Visit: Attending: Cardiology | Admitting: Cardiology

## 2023-05-18 ENCOUNTER — Encounter: Payer: Self-pay | Admitting: Cardiology

## 2023-05-18 VITALS — BP 122/58 | HR 79 | Ht 59.0 in | Wt 150.4 lb

## 2023-05-18 DIAGNOSIS — Z950 Presence of cardiac pacemaker: Secondary | ICD-10-CM

## 2023-05-18 DIAGNOSIS — I484 Atypical atrial flutter: Secondary | ICD-10-CM

## 2023-05-18 DIAGNOSIS — I442 Atrioventricular block, complete: Secondary | ICD-10-CM

## 2023-05-18 DIAGNOSIS — I2510 Atherosclerotic heart disease of native coronary artery without angina pectoris: Secondary | ICD-10-CM | POA: Diagnosis not present

## 2023-05-18 DIAGNOSIS — Z951 Presence of aortocoronary bypass graft: Secondary | ICD-10-CM | POA: Diagnosis not present

## 2023-05-18 DIAGNOSIS — Z954 Presence of other heart-valve replacement: Secondary | ICD-10-CM

## 2023-05-18 NOTE — Patient Instructions (Signed)
 Medication Instructions:  Your physician recommends that you continue on your current medications as directed. Please refer to the Current Medication list given to you today.  *If you need a refill on your cardiac medications before your next appointment, please call your pharmacy*   Lab Work: None Ordered If you have labs (blood work) drawn today and your tests are completely normal, you will receive your results only by: MyChart Message (if you have MyChart) OR A paper copy in the mail If you have any lab test that is abnormal or we need to change your treatment, we will call you to review the results.   Testing/Procedures: None Ordered   Follow-Up: At [facility], you and your health needs are our priority.  As part of our continuing mission to provide you with exceptional heart care, we have created designated Provider Care Teams.  These Care Teams include your primary Cardiologist (physician) and Advanced Practice Providers (APPs -  Physician Assistants and Nurse Practitioners) who all work together to provide you with the care you need, when you need it.  We recommend signing up for the patient portal called "MyChart".  Sign up information is provided on this After Visit Summary.  MyChart is used to connect with patients for Virtual Visits (Telemedicine).  Patients are able to view lab/test results, encounter notes, upcoming appointments, etc.  Non-urgent messages can be sent to your provider as well.   To learn more about what you can do with MyChart, go to [email or web address].    Your next appointment:   6 month

## 2023-06-16 NOTE — Addendum Note (Signed)
 Addended by: Lott Rouleau A on: 06/16/2023 03:09 PM   Modules accepted: Orders

## 2023-06-16 NOTE — Progress Notes (Signed)
 Remote pacemaker transmission.

## 2023-07-14 DIAGNOSIS — H9113 Presbycusis, bilateral: Secondary | ICD-10-CM | POA: Insufficient documentation

## 2023-07-14 DIAGNOSIS — H903 Sensorineural hearing loss, bilateral: Secondary | ICD-10-CM | POA: Insufficient documentation

## 2023-08-02 ENCOUNTER — Ambulatory Visit: Payer: Medicare Other

## 2023-08-02 DIAGNOSIS — I442 Atrioventricular block, complete: Secondary | ICD-10-CM | POA: Diagnosis not present

## 2023-08-03 LAB — CUP PACEART REMOTE DEVICE CHECK
Battery Remaining Longevity: 109 mo
Battery Voltage: 3 V
Brady Statistic AP VP Percent: 54.63 %
Brady Statistic AP VS Percent: 0 %
Brady Statistic AS VP Percent: 45.35 %
Brady Statistic AS VS Percent: 0.01 %
Brady Statistic RA Percent Paced: 54.61 %
Brady Statistic RV Percent Paced: 99.98 %
Date Time Interrogation Session: 20250722105141
Implantable Lead Connection Status: 753985
Implantable Lead Connection Status: 753985
Implantable Lead Implant Date: 20220331
Implantable Lead Implant Date: 20220331
Implantable Lead Location: 753859
Implantable Lead Location: 753860
Implantable Lead Model: 5076
Implantable Lead Model: 5076
Implantable Pulse Generator Implant Date: 20220331
Lead Channel Impedance Value: 285 Ohm
Lead Channel Impedance Value: 380 Ohm
Lead Channel Impedance Value: 494 Ohm
Lead Channel Impedance Value: 532 Ohm
Lead Channel Pacing Threshold Amplitude: 0.5 V
Lead Channel Pacing Threshold Amplitude: 0.625 V
Lead Channel Pacing Threshold Pulse Width: 0.4 ms
Lead Channel Pacing Threshold Pulse Width: 0.4 ms
Lead Channel Sensing Intrinsic Amplitude: 1.75 mV
Lead Channel Sensing Intrinsic Amplitude: 1.75 mV
Lead Channel Sensing Intrinsic Amplitude: 17.375 mV
Lead Channel Sensing Intrinsic Amplitude: 17.375 mV
Lead Channel Setting Pacing Amplitude: 1.5 V
Lead Channel Setting Pacing Amplitude: 2 V
Lead Channel Setting Pacing Pulse Width: 0.4 ms
Lead Channel Setting Sensing Sensitivity: 1.2 mV
Zone Setting Status: 755011
Zone Setting Status: 755011

## 2023-08-08 ENCOUNTER — Ambulatory Visit: Payer: Self-pay | Admitting: Cardiology

## 2023-08-26 DIAGNOSIS — N393 Stress incontinence (female) (male): Secondary | ICD-10-CM | POA: Insufficient documentation

## 2023-08-26 DIAGNOSIS — Z7185 Encounter for immunization safety counseling: Secondary | ICD-10-CM | POA: Insufficient documentation

## 2023-09-27 ENCOUNTER — Other Ambulatory Visit: Payer: Self-pay | Admitting: Cardiology

## 2023-10-14 NOTE — Progress Notes (Signed)
 Remote PPM Transmission

## 2023-11-01 ENCOUNTER — Ambulatory Visit: Payer: Medicare Other

## 2023-11-01 DIAGNOSIS — I442 Atrioventricular block, complete: Secondary | ICD-10-CM | POA: Diagnosis not present

## 2023-11-02 LAB — CUP PACEART REMOTE DEVICE CHECK
Battery Remaining Longevity: 106 mo
Battery Voltage: 3 V
Brady Statistic AP VP Percent: 63.01 %
Brady Statistic AP VS Percent: 0 %
Brady Statistic AS VP Percent: 36.97 %
Brady Statistic AS VS Percent: 0.01 %
Brady Statistic RA Percent Paced: 62.99 %
Brady Statistic RV Percent Paced: 99.98 %
Date Time Interrogation Session: 20251021001243
Implantable Lead Connection Status: 753985
Implantable Lead Connection Status: 753985
Implantable Lead Implant Date: 20220331
Implantable Lead Implant Date: 20220331
Implantable Lead Location: 753859
Implantable Lead Location: 753860
Implantable Lead Model: 5076
Implantable Lead Model: 5076
Implantable Pulse Generator Implant Date: 20220331
Lead Channel Impedance Value: 285 Ohm
Lead Channel Impedance Value: 361 Ohm
Lead Channel Impedance Value: 494 Ohm
Lead Channel Impedance Value: 532 Ohm
Lead Channel Pacing Threshold Amplitude: 0.625 V
Lead Channel Pacing Threshold Amplitude: 0.625 V
Lead Channel Pacing Threshold Pulse Width: 0.4 ms
Lead Channel Pacing Threshold Pulse Width: 0.4 ms
Lead Channel Sensing Intrinsic Amplitude: 1.625 mV
Lead Channel Sensing Intrinsic Amplitude: 1.625 mV
Lead Channel Sensing Intrinsic Amplitude: 17.375 mV
Lead Channel Sensing Intrinsic Amplitude: 17.375 mV
Lead Channel Setting Pacing Amplitude: 1.5 V
Lead Channel Setting Pacing Amplitude: 2 V
Lead Channel Setting Pacing Pulse Width: 0.4 ms
Lead Channel Setting Sensing Sensitivity: 1.2 mV
Zone Setting Status: 755011
Zone Setting Status: 755011

## 2023-11-04 NOTE — Progress Notes (Signed)
 Remote PPM Transmission

## 2023-11-07 ENCOUNTER — Ambulatory Visit: Payer: Self-pay | Admitting: Cardiology

## 2023-11-15 NOTE — Progress Notes (Unsigned)
 Cardiology Office Note:    Date:  11/16/2023   ID:  [name], DOB May 06, 1935, MRN [id number]  PCP:  Ryan Powell HERO., MD  Cardiologist:  Redell Leiter, MD    Referring MD: Ryan Powell HERO., MD    ASSESSMENT:    1. Atypical atrial flutter (HCC)   2. Heart block AV complete (HCC)   3. Cardiac pacemaker in situ   4. Coronary artery disease involving native coronary artery of native heart without angina pectoris   5. Hx of CABG   6. H/O aortic valve replacement with tissue graft   7. 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 Dyslipidemia    PLAN:    In order of problems listed above:  [name] continues to do well from a cardiology perspective device check 4 months ago shows her to be dual-chamber paced and no episodes of atrial flutter.  She continues to follow in our device clinic Stable CAD she has had no anginal disc comfort continue her medical therapy including the aspirin pravastatin  and fish oil Stable AVR function BP well-controlled heart failure is compensated she has no edema she will continue loop diuretic torsemide  spironolactone  or beta-blocker. Her LDL is at target with her current statin   Next appointment: 1 year   Medication Adjustments/Labs and Tests Ordered: Current medicines are reviewed at length with the patient today.  Concerns regarding medicines are outlined above.  No orders of the defined types were placed in this encounter.  No orders of the defined types were placed in this encounter.    History of Present Illness:    [name] is a 88 y.o. female with a hx of atrial flutter with chronic anticoagulation permanent pacemaker CAD with CABG and bioprosthetic AVR 2011 hypertensive heart and chronic kidney disease with heart failure hyperlipidemia mild calcific mitral stenosis and previous traumatic subarachnoid hemorrhage last seen 11/17/2022.  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 Past Medical History:  Diagnosis Date   Age related osteoporosis 08/26/2015   Anemia due to vitamin B12 deficiency 08/26/2015   Angina pectoris 12/30/2014   Aortic atherosclerosis 02/14/2022   Atrial flutter, paroxysmal (HCC) 08/27/2020   Beat, premature ventricular 12/30/2014   Benign essential hypertension 08/26/2015   CAD in native artery 12/30/2014   Cancer of skin, squamous cell 04/19/2023   Cardiorenal disease 12/30/2014   Chronic diastolic heart failure (HCC) 12/30/2014   Chronic LLQ pain 05/26/2017   CKD (chronic kidney disease) stage 3, GFR 30-59 ml/min (HCC) 12/30/2014   Controlled type 2 diabetes mellitus with mild nonproliferative retinopathy, without long-term current use of insulin  (HCC) 03/25/2019   Contusion of right hip 03/18/2020   Diabetes (HCC)    Diverticulitis 07/20/2022   Diverticulosis 02/13/2020   DM type 2 with diabetic peripheral neuropathy (HCC) 04/24/2023   Drug-induced chronic gout of ankle 11/23/2018   Dyslipidemia 03/20/2018  Generalized seizure (HCC) 06/11/2020   Formatting of this note might be different from the original.  Last 04/05/20     GERD (gastroesophageal reflux disease) 08/26/2015   Gout 11/27/2019   H/O aortic valve replacement with tissue graft 12/30/2014   Overview:  S/P AVR with tissue valve, March 2011, #21 [facility] Ease bovine and  CABG   Heart failure, unspecified (HCC) 01/12/2020   High blood pressure    High cholesterol    History of diverticulosis 05/26/2017   History of falling 03/21/2020   Hypertensive heart and chronic kidney disease with heart failure and stage 1 through stage 4 chronic kidney disease, or chronic kidney disease (HCC) 12/30/2014   Incarcerated incisional hernia 05/14/2015   Left ventricular hypertrophy 08/26/2015   Myalgia 07/20/2022   Obesity 08/26/2015   Occlusion and stenosis of bilateral carotid arteries 01/12/2020   OSA (obstructive sleep apnea) 08/26/2015   Other amnesia 03/18/2020   Pain in both lower extremities 07/20/2022   Postherpetic neuralgia 07/20/2022   Presence of aortocoronary bypass graft 01/11/2018   Presence of cardiac pacemaker 04/29/2020   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 01/12/2020   Rheumatic heart failure (HCC) 04/29/2020   Scalp laceration    Secondary hypersomnolence disorder 08/26/2015   Seizure disorder (HCC) 08/27/2020   Subarachnoid bleed (HCC) 03/19/2020   Subarachnoid hemorrhage (HCC) 03/19/2020   Subarachnoid hemorrhage following injury, concussion (HCC) 04/11/2020   Subcutaneous hematoma    Subdural hematoma (HCC) 03/18/2020   Syncope    Thyroid  nodule 07/20/2022   Trigger finger of right thumb 02/04/2021   Type 2 diabetes mellitus with peripheral angiopathy (HCC) 04/24/2015   Type 2 diabetes mellitus without complication, without long-term current use of insulin  (HCC)    Type 2 diabetes w unsp diabetic rtnop w/o macular edema (HCC) 01/11/2018   Wellness examination 04/10/2020   Last Assessment & Plan:   Formatting of this note might be different from the original.  Normal Device Function     Adjusted Outputs to Maximize Safety and Longevity  Ongoing atrial tach/flutter.  I was highly tempted to try to interface with the rhythm to pace terminated however the patient is not anticoagulated and is not a good candidate for  anticoagulation.  -  Follow up 1 year unless she has     Current Medications: Current Meds  Medication Sig   allopurinol  (ZYLOPRIM ) 100 MG tablet Take 100 mg by mouth 2 (two) times daily.   aspirin EC 81 MG tablet Take 81 mg by mouth daily. Swallow whole.   cholecalciferol (VITAMIN D3) 25 MCG (1000 UNIT) tablet Take 1,000 Units by mouth daily.   colchicine 0.6 MG tablet Take 0.6 mg by mouth daily as needed (Gout).   EPIPEN  2-PAK 0.3 MG/0.3ML SOAJ injection Inject 0.3 mg into the muscle as needed for anaphylaxis.   ferrous sulfate 325 (65 FE) MG tablet Take 325 mg by mouth daily.    losartan  (COZAAR ) 25 MG tablet Take 12.5 mg by mouth daily.   MAGNESIUM  OXIDE PO Take 30 mg by mouth daily.   metoprolol  succinate (TOPROL -XL) 25 MG 24 hr tablet TAKE 1 TABLET BY MOUTH AT  BEDTIME   Multiple Vitamins-Minerals (MULTIVITAMIN ADULTS) TABS Take 1 tablet by mouth daily.   nitroGLYCERIN (NITROSTAT) 0.4 MG SL tablet Place 0.4 mg under the tongue as needed for chest pain.   Omega-3 1000 MG CAPS Take 1,000 mg by mouth 2 (two) times daily.   pravastatin  (PRAVACHOL ) 80 MG tablet Take 80 mg  by mouth daily.   pregabalin (LYRICA) 25 MG capsule Take 25 mg by mouth daily.   spironolactone  (ALDACTONE ) 25 MG tablet Take 12.5 mg by mouth daily.   tobramycin (TOBREX) 0.3 % ophthalmic solution Place 1 drop into both eyes See admin instructions. Take every 3 months before and after shot in eye   TOLAK 4 % CREA Apply 1 Application topically daily.   torsemide  (DEMADEX ) 10 MG tablet TAKE 1 TABLET BY MOUTH DAILY   valACYclovir (VALTREX) 500 MG tablet Take 1 tablet by mouth daily.   vitamin B-12 (CYANOCOBALAMIN ) 1000 MCG tablet Take 1,000 mcg by mouth daily.      EKGs/Labs/Other Studies Reviewed:    The following studies were reviewed today:  Cardiac Studies & Procedures   ______________________________________________________________________________________________     ECHOCARDIOGRAM  ECHOCARDIOGRAM  COMPLETE 06/13/2020  Narrative ECHOCARDIOGRAM REPORT    Patient Name:   [name]  Date of Exam: 06/13/2020 Medical Rec #:  [id number]  Height:       60.5 in Accession #:    [id number] Weight:       137.0 lb Date of Birth:  March 05, 1935  BSA:          1.599 m Patient Age:    85 years   BP:           140/68 mmHg Patient Gender: F          HR:           70 bpm. Exam Location:  [place]  Procedure: 2D Echo  Indications:    H/O aortic valve replacement with tissue graft [Z95.4 (ICD-10-CM)]  History:        Patient has prior history of Echocardiogram examinations, most recent 03/19/2020. CHF, CAD, Signs/Symptoms:Syncope; Risk Factors:Diabetes. Aortic Valve: valve is present in the aortic position. Procedure Date: March 2011 with a #21 [facility] Ease bovine AVR.  Sonographer:    Lynwood Silvas Referring Phys: [id number] Arlin Savona J Betina Puckett  IMPRESSIONS   1. Left ventricular ejection fraction, by estimation, is 60 to 65%. The left ventricle has normal function. The left ventricle has no regional wall motion abnormalities. Left ventricular diastolic parameters are consistent with Grade I diastolic dysfunction (impaired relaxation). 2. Right ventricular systolic function is mildly reduced. The right ventricular size is normal. There is mildly elevated pulmonary artery systolic pressure. 3. Left atrial size was mildly dilated. 4. The mitral valve is normal in structure. Mild mitral valve regurgitation. Mild mitral stenosis. Moderate mitral annular calcification. 5. The aortic valve is normal in structure. Aortic valve regurgitation is not visualized. There is a valve present in the aortic position. Procedure Date: March 2011 with a #21 [facility] Ease bovine AVR. Echo findings are consistent with normal structure and function of the aortic valve prosthesis. 6. The inferior vena cava is normal in size with greater than 50% respiratory variability, suggesting right atrial pressure of 3  mmHg.  FINDINGS Left Ventricle: Left ventricular ejection fraction, by estimation, is 60 to 65%. The left ventricle has normal function. The left ventricle has no regional wall motion abnormalities. The left ventricular internal cavity size was normal in size. There is no left ventricular hypertrophy. Left ventricular diastolic parameters are consistent with Grade I diastolic dysfunction (impaired relaxation). Indeterminate filling pressures.  Right Ventricle: The right ventricular size is normal. No increase in right ventricular wall thickness. Right ventricular systolic function is mildly reduced. There is mildly elevated pulmonary artery systolic pressure. The tricuspid regurgitant velocity is 2.67 m/s, and with an  assumed right atrial pressure of 8 mmHg, the estimated right ventricular systolic pressure is 36.5 mmHg.  Left Atrium: Left atrial size was mildly dilated.  Right Atrium: Right atrial size was normal in size.  Pericardium: There is no evidence of pericardial effusion.  Mitral Valve: The mitral valve is normal in structure. There is mild thickening of the mitral valve leaflet(s). Moderate mitral annular calcification. Mild mitral valve regurgitation. Mild mitral valve stenosis. MV peak gradient, 11.7 mmHg. The mean mitral valve gradient is 4.0 mmHg.  Tricuspid Valve: The tricuspid valve is normal in structure. Tricuspid valve regurgitation is mild . No evidence of tricuspid stenosis.  Aortic Valve: The aortic valve is normal in structure. Aortic valve regurgitation is not visualized. Aortic valve mean gradient measures 17.0 mmHg. Aortic valve peak gradient measures 30.9 mmHg. Aortic valve area, by VTI measures 1.18 cm. There is a valve present in the aortic position. Procedure Date: March 2011 with a #21 [facility] Ease bovine AVR. Echo findings are consistent with normal structure and function of the aortic valve prosthesis.  Pulmonic Valve: The pulmonic valve was normal in  structure. Pulmonic valve regurgitation is not visualized. No evidence of pulmonic stenosis.  Aorta: The aortic root and ascending aorta are structurally normal, with no evidence of dilitation.  Venous: A normal flow pattern is recorded from the right upper pulmonary vein. The inferior vena cava is normal in size with greater than 50% respiratory variability, suggesting right atrial pressure of 3 mmHg.  IAS/Shunts: No atrial level shunt detected by color flow Doppler.  Additional Comments: A device lead is visualized in the right ventricle.   LEFT VENTRICLE PLAX 2D LVIDd:         3.00 cm  Diastology LVIDs:         2.20 cm  LV e' medial:    3.62 cm/s LV PW:         1.90 cm  LV E/e' medial:  32.6 LV IVS:        2.10 cm  LV e' lateral:   3.87 cm/s LVOT diam:     1.90 cm  LV E/e' lateral: 30.5 LV SV:         73 LV SV Index:   45       2D Longitudinal Strain LVOT Area:     2.84 cm 2D Strain GLS Avg:     -15.6 %   RIGHT VENTRICLE            IVC RV S prime:     6.25 cm/s  IVC diam: 2.00 cm TAPSE (M-mode): 1.3 cm  LEFT ATRIUM             Index       RIGHT ATRIUM           Index LA diam:        3.80 cm 2.38 cm/m  RA Area:     13.70 cm LA Vol (A2C):   69.8 ml 43.65 ml/m RA Volume:   33.70 ml  21.07 ml/m LA Vol (A4C):   53.0 ml 33.14 ml/m LA Biplane Vol: 63.6 ml 39.77 ml/m AORTIC VALVE AV Area (Vmax):    1.11 cm AV Area (Vmean):   1.16 cm AV Area (VTI):     1.18 cm AV Vmax:           278.00 cm/s AV Vmean:          194.000 cm/s AV VTI:  0.617 m AV Peak Grad:      30.9 mmHg AV Mean Grad:      17.0 mmHg LVOT Vmax:         109.00 cm/s LVOT Vmean:        79.600 cm/s LVOT VTI:          0.256 m LVOT/AV VTI ratio: 0.41  AORTA Ao Root diam: 2.00 cm Ao Asc diam:  3.10 cm  MITRAL VALVE                TRICUSPID VALVE MV Area (PHT): 3.11 cm     TR Peak grad:   28.5 mmHg MV Peak grad:  11.7 mmHg    TR Vmax:        267.00 cm/s MV Mean grad:  4.0 mmHg MV Vmax:        1.71 m/s     SHUNTS MV Vmean:      93.0 cm/s    Systemic VTI:  0.26 m MV Decel Time: 244 msec     Systemic Diam: 1.90 cm MV E velocity: 118.00 cm/s MV A velocity: 142.00 cm/s MV E/A ratio:  0.83  Redell Leiter MD Electronically signed by Redell Leiter MD Signature Date/Time: 06/13/2020/1:08:03 PM    Final    MONITORS  LONG TERM MONITOR (3-14 DAYS) 04/19/2019  Narrative A ZIO monitor was performed 3 days 6 hours beginning 04/06/2019 to assess palpitation.  The predominant rhythm is sinus first-degree AV block minimum average and maximum heart rates of 55, 70 and 94 bpm.  There were no pauses of 3 seconds or greater and no episodes of second or third-degree AV nodal block or sinus node exit block.  Ventricular ectopy was rare with isolated PVCs and couplets.  Supraventricular ectopy was rare there were no episodes of atrial fibrillation or flutter.  There were 17 runs of atrial premature contractions, SVT the longest and fastest 32 minutes 18 seconds at a rate of approximately 150 bpm.  There were no flutter waves visible.  On termination there is no prolonged bradycardia.  There were no triggered or diary events.   Conclusion:  SVT.       ______________________________________________________________________________________________      I did not repeat an EKG today she had 1 in July   Physical Exam:    VS:  BP 114/64   Pulse 88   Ht 4' 11 (1.499 m)   Wt 152 lb 12.8 oz (69.3 kg)   SpO2 96%   BMI 30.86 kg/m     Wt Readings from Last 3 Encounters:  11/16/23 152 lb 12.8 oz (69.3 kg)  05/18/23 150 lb 6.4 oz (68.2 kg)  11/17/22 151 lb (68.5 kg)     GEN:  Well nourished, well developed in no acute distress HEENT: Normal NECK: No JVD; No carotid bruits LYMPHATICS: No lymphadenopathy CARDIAC: RRR, no murmurs, rubs, gallops normal exam bioprosthetic AVR she has a very soft 1 of 6 flow murmur RESPIRATORY:  Clear to auscultation without rales, wheezing or rhonchi   ABDOMEN: Soft, non-tender, non-distended MUSCULOSKELETAL:  No edema; No deformity  SKIN: Warm and dry NEUROLOGIC:  Alert and oriented x 3 PSYCHIATRIC:  Normal affect    Signed, Redell Leiter, MD  11/16/2023 4:30 PM    [facility]

## 2023-11-16 ENCOUNTER — Ambulatory Visit: Attending: Cardiology | Admitting: Cardiology

## 2023-11-16 VITALS — BP 114/64 | HR 88 | Ht 59.0 in | Wt 152.8 lb

## 2023-11-16 DIAGNOSIS — I484 Atypical atrial flutter: Secondary | ICD-10-CM | POA: Diagnosis not present

## 2023-11-16 DIAGNOSIS — Z950 Presence of cardiac pacemaker: Secondary | ICD-10-CM

## 2023-11-16 DIAGNOSIS — I2510 Atherosclerotic heart disease of native coronary artery without angina pectoris: Secondary | ICD-10-CM

## 2023-11-16 DIAGNOSIS — I130 Hypertensive heart and chronic kidney disease with heart failure and stage 1 through stage 4 chronic kidney disease, or unspecified chronic kidney disease: Secondary | ICD-10-CM

## 2023-11-16 DIAGNOSIS — Z951 Presence of aortocoronary bypass graft: Secondary | ICD-10-CM

## 2023-11-16 DIAGNOSIS — E785 Hyperlipidemia, unspecified: Secondary | ICD-10-CM

## 2023-11-16 DIAGNOSIS — Z954 Presence of other heart-valve replacement: Secondary | ICD-10-CM

## 2023-11-16 DIAGNOSIS — I5032 Chronic diastolic (congestive) heart failure: Secondary | ICD-10-CM

## 2023-11-16 DIAGNOSIS — I442 Atrioventricular block, complete: Secondary | ICD-10-CM | POA: Diagnosis not present

## 2023-11-16 NOTE — Patient Instructions (Signed)
Medication Instructions:  Your physician recommends that you continue on your current medications as directed. Please refer to the Current Medication list given to you today.  *If you need a refill on your cardiac medications before your next appointment, please call your pharmacy*   Lab Work: None Ordered If you have labs (blood work) drawn today and your tests are completely normal, you will receive your results only by: MyChart Message (if you have MyChart) OR A paper copy in the mail If you have any lab test that is abnormal or we need to change your treatment, we will call you to review the results.   Testing/Procedures: None Ordered   Follow-Up: At [facility], you and your health needs are our priority.  As part of our continuing mission to provide you with exceptional heart care, we have created designated Provider Care Teams.  These Care Teams include your primary Cardiologist (physician) and Advanced Practice Providers (APPs -  Physician Assistants and Nurse Practitioners) who all work together to provide you with the care you need, when you need it.  We recommend signing up for the patient portal called "MyChart".  Sign up information is provided on this After Visit Summary.  MyChart is used to connect with patients for Virtual Visits (Telemedicine).  Patients are able to view lab/test results, encounter notes, upcoming appointments, etc.  Non-urgent messages can be sent to your provider as well.   To learn more about what you can do with MyChart, go to [email or web address].    Your next appointment:   12 month(s)  The format for your next appointment:   In Person  Provider:   Brian Munley, MD    Other Instructions NA  

## 2024-01-31 ENCOUNTER — Ambulatory Visit: Payer: Medicare Other

## 2024-01-31 DIAGNOSIS — I442 Atrioventricular block, complete: Secondary | ICD-10-CM

## 2024-02-01 LAB — CUP PACEART REMOTE DEVICE CHECK
Battery Remaining Longevity: 102 mo
Battery Voltage: 2.99 V
Brady Statistic AP VP Percent: 62.99 %
Brady Statistic AP VS Percent: 0 %
Brady Statistic AS VP Percent: 36.98 %
Brady Statistic AS VS Percent: 0.01 %
Brady Statistic RA Percent Paced: 60.78 %
Brady Statistic RV Percent Paced: 99.98 %
Date Time Interrogation Session: 20260121071230
Implantable Lead Connection Status: 753985
Implantable Lead Connection Status: 753985
Implantable Lead Implant Date: 20220331
Implantable Lead Implant Date: 20220331
Implantable Lead Location: 753859
Implantable Lead Location: 753860
Implantable Lead Model: 5076
Implantable Lead Model: 5076
Implantable Pulse Generator Implant Date: 20220331
Lead Channel Impedance Value: 304 Ohm
Lead Channel Impedance Value: 361 Ohm
Lead Channel Impedance Value: 475 Ohm
Lead Channel Impedance Value: 513 Ohm
Lead Channel Pacing Threshold Amplitude: 0.5 V
Lead Channel Pacing Threshold Amplitude: 0.625 V
Lead Channel Pacing Threshold Pulse Width: 0.4 ms
Lead Channel Pacing Threshold Pulse Width: 0.4 ms
Lead Channel Sensing Intrinsic Amplitude: 1.625 mV
Lead Channel Sensing Intrinsic Amplitude: 1.625 mV
Lead Channel Sensing Intrinsic Amplitude: 16 mV
Lead Channel Sensing Intrinsic Amplitude: 16 mV
Lead Channel Setting Pacing Amplitude: 1.5 V
Lead Channel Setting Pacing Amplitude: 2 V
Lead Channel Setting Pacing Pulse Width: 0.4 ms
Lead Channel Setting Sensing Sensitivity: 1.2 mV
Zone Setting Status: 755011
Zone Setting Status: 755011

## 2024-02-02 ENCOUNTER — Ambulatory Visit: Payer: Self-pay | Admitting: Cardiology

## 2024-02-03 NOTE — Progress Notes (Signed)
 Remote PPM Transmission

## 2024-02-08 ENCOUNTER — Other Ambulatory Visit: Payer: Self-pay | Admitting: Cardiology

## 2024-05-01 ENCOUNTER — Ambulatory Visit: Payer: Medicare Other

## 2024-07-31 ENCOUNTER — Ambulatory Visit: Payer: Medicare Other

## 2024-10-30 ENCOUNTER — Ambulatory Visit: Payer: Medicare Other

## 2025-01-29 ENCOUNTER — Ambulatory Visit: Payer: Medicare Other

## 2025-04-30 ENCOUNTER — Ambulatory Visit: Payer: Medicare Other
